# Patient Record
Sex: Female | Born: 1970 | Race: White | Hispanic: No | Marital: Married | Smoking: Never smoker
Health system: Southern US, Community
[De-identification: ages and names within clinical notes are randomized; demographics above are authoritative.]

## PROBLEM LIST (undated history)

## (undated) DIAGNOSIS — R0789 Other chest pain: Secondary | ICD-10-CM

## (undated) DIAGNOSIS — R079 Chest pain, unspecified: Secondary | ICD-10-CM

## (undated) DIAGNOSIS — T7840XA Allergy, unspecified, initial encounter: Secondary | ICD-10-CM

## (undated) DIAGNOSIS — K219 Gastro-esophageal reflux disease without esophagitis: Secondary | ICD-10-CM

## (undated) DIAGNOSIS — K200 Eosinophilic esophagitis: Secondary | ICD-10-CM

## (undated) DIAGNOSIS — I10 Essential (primary) hypertension: Secondary | ICD-10-CM

## (undated) DIAGNOSIS — R030 Elevated blood-pressure reading, without diagnosis of hypertension: Secondary | ICD-10-CM

## (undated) DIAGNOSIS — D649 Anemia, unspecified: Secondary | ICD-10-CM

## (undated) DIAGNOSIS — R9431 Abnormal electrocardiogram [ECG] [EKG]: Secondary | ICD-10-CM

## (undated) HISTORY — DX: Elevated blood-pressure reading, without diagnosis of hypertension: R03.0

## (undated) HISTORY — PX: COLONOSCOPY: SHX174

## (undated) HISTORY — DX: Eosinophilic esophagitis: K20.0

## (undated) HISTORY — DX: Anemia, unspecified: D64.9

## (undated) HISTORY — DX: Essential (primary) hypertension: I10

## (undated) HISTORY — DX: Chest pain, unspecified: R07.9

## (undated) HISTORY — PX: DILATION AND CURETTAGE OF UTERUS: SHX78

## (undated) HISTORY — DX: Allergy, unspecified, initial encounter: T78.40XA

## (undated) HISTORY — DX: Other chest pain: R07.89

## (undated) HISTORY — DX: Gastro-esophageal reflux disease without esophagitis: K21.9

## (undated) HISTORY — PX: FOOT SURGERY: SHX648

## (undated) HISTORY — DX: Abnormal electrocardiogram (ECG) (EKG): R94.31

## (undated) HISTORY — PX: UPPER GASTROINTESTINAL ENDOSCOPY: SHX188

---

## 2003-04-11 ENCOUNTER — Other Ambulatory Visit: Admission: RE | Admit: 2003-04-11 | Discharge: 2003-04-11 | Payer: Self-pay | Admitting: *Deleted

## 2004-04-13 ENCOUNTER — Other Ambulatory Visit: Admission: RE | Admit: 2004-04-13 | Discharge: 2004-04-13 | Payer: Self-pay | Admitting: *Deleted

## 2005-02-11 ENCOUNTER — Other Ambulatory Visit: Admission: RE | Admit: 2005-02-11 | Discharge: 2005-02-11 | Payer: Self-pay | Admitting: Obstetrics & Gynecology

## 2005-04-30 ENCOUNTER — Inpatient Hospital Stay (HOSPITAL_COMMUNITY): Admission: AD | Admit: 2005-04-30 | Discharge: 2005-05-01 | Payer: Self-pay | Admitting: Obstetrics and Gynecology

## 2005-05-26 ENCOUNTER — Ambulatory Visit (HOSPITAL_COMMUNITY): Admission: RE | Admit: 2005-05-26 | Discharge: 2005-05-26 | Payer: Self-pay | Admitting: Obstetrics and Gynecology

## 2005-06-30 ENCOUNTER — Ambulatory Visit (HOSPITAL_COMMUNITY): Admission: RE | Admit: 2005-06-30 | Discharge: 2005-06-30 | Payer: Self-pay | Admitting: Obstetrics & Gynecology

## 2005-09-15 ENCOUNTER — Inpatient Hospital Stay (HOSPITAL_COMMUNITY): Admission: RE | Admit: 2005-09-15 | Discharge: 2005-09-17 | Payer: Self-pay | Admitting: Obstetrics and Gynecology

## 2005-09-18 ENCOUNTER — Encounter: Admission: RE | Admit: 2005-09-18 | Discharge: 2005-10-17 | Payer: Self-pay | Admitting: Obstetrics and Gynecology

## 2007-05-07 IMAGING — US US OB COMP +14 WK
1 series · 13 of 25 positions shown · non-contrast
Comparison: none

CLINICAL DATA: Right lower quadrant pain which has been unrelenting for at least 24 hours.  During the ultrasound exam, the patient described the pain as originating in the right flank region and then radiating around anteriorly into the right lower quadrant.

[Series 1: us ob comp +14 wk · 0.33mm/px · 13 of 25 slices shown]
[im 1/25]
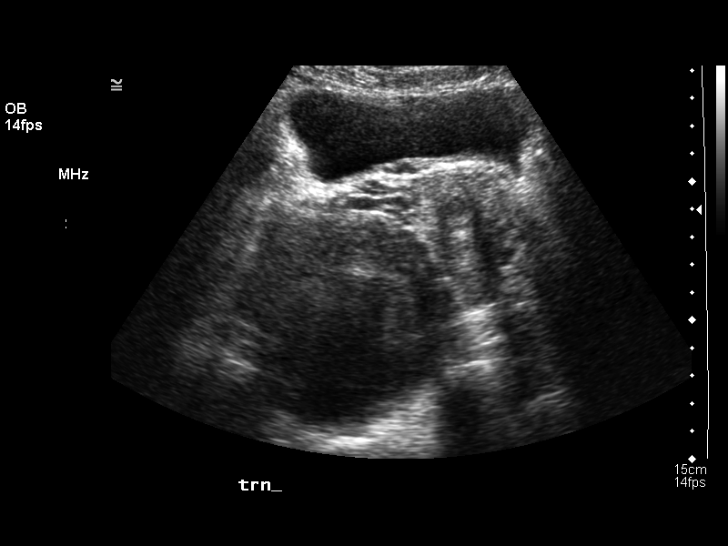
[im 3/25]
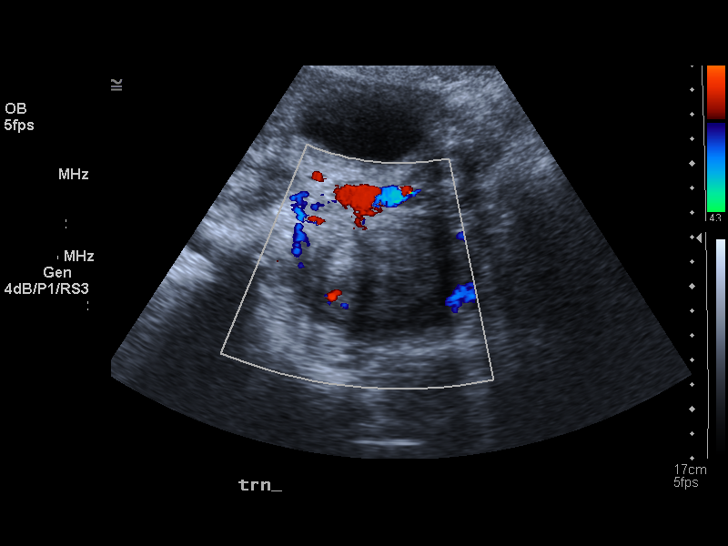
[im 5/25]
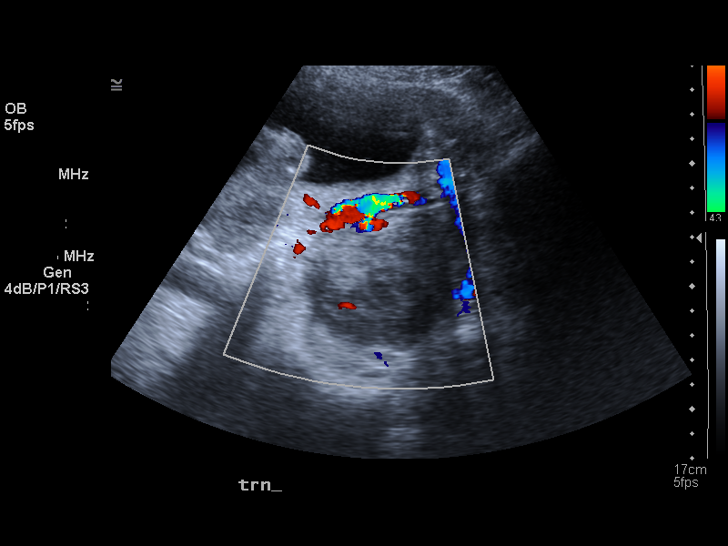
[im 7/25]
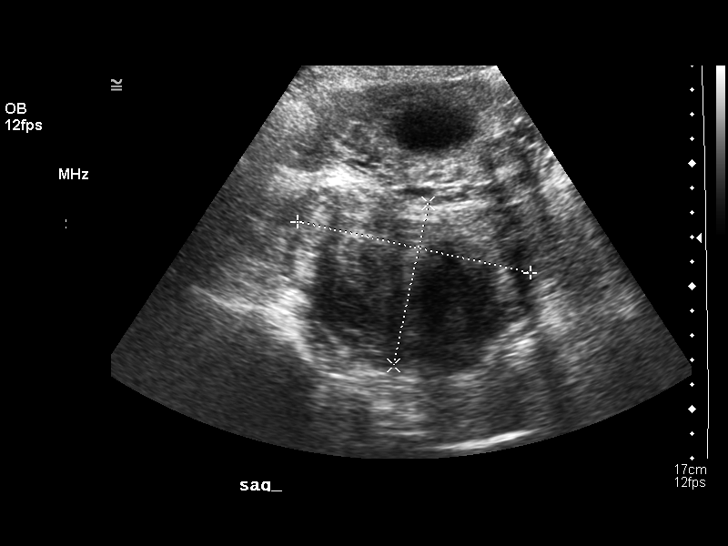
[im 9/25]
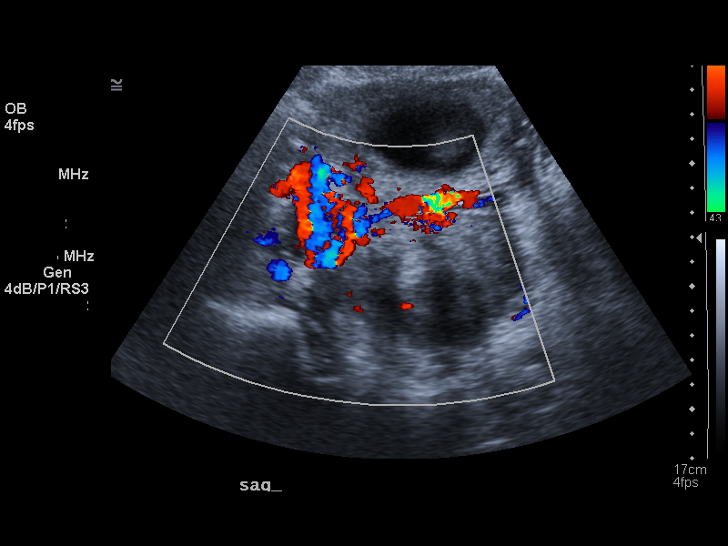
[im 11/25]
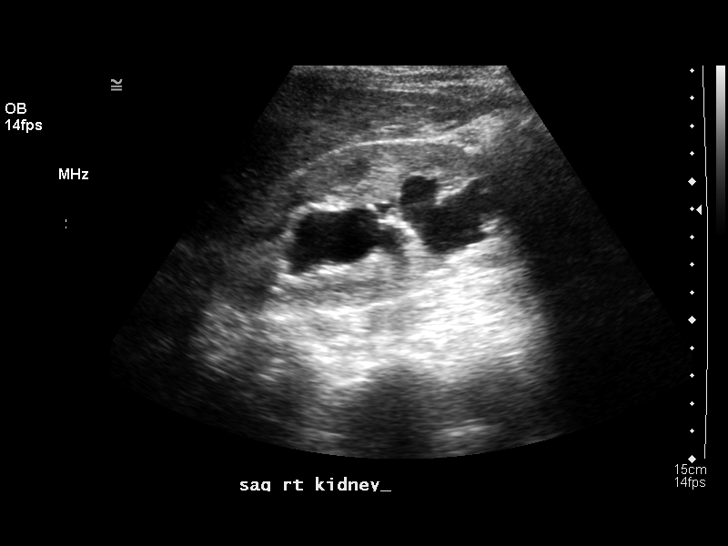
[im 13/25]
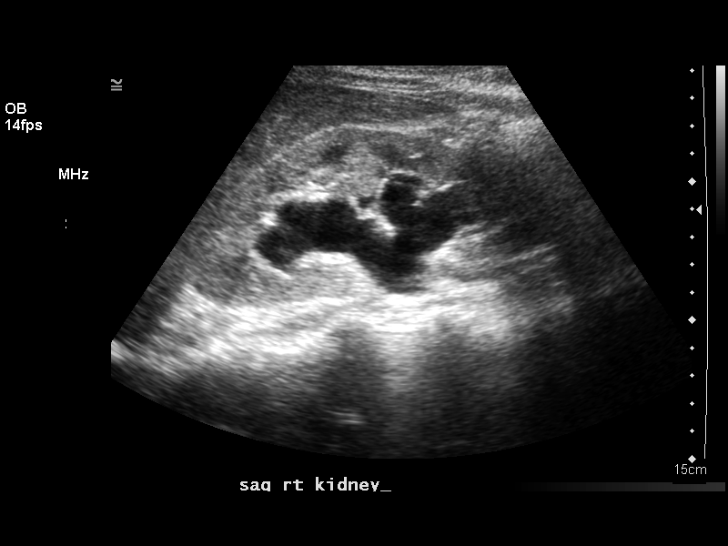
[im 15/25]
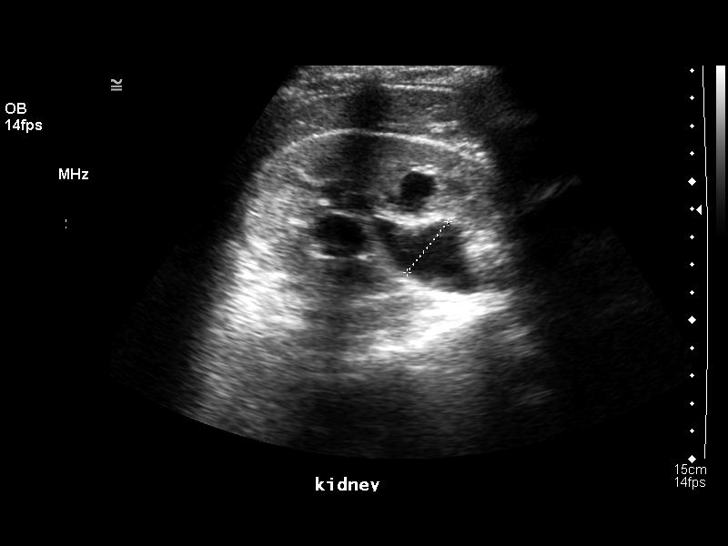
[im 17/25]
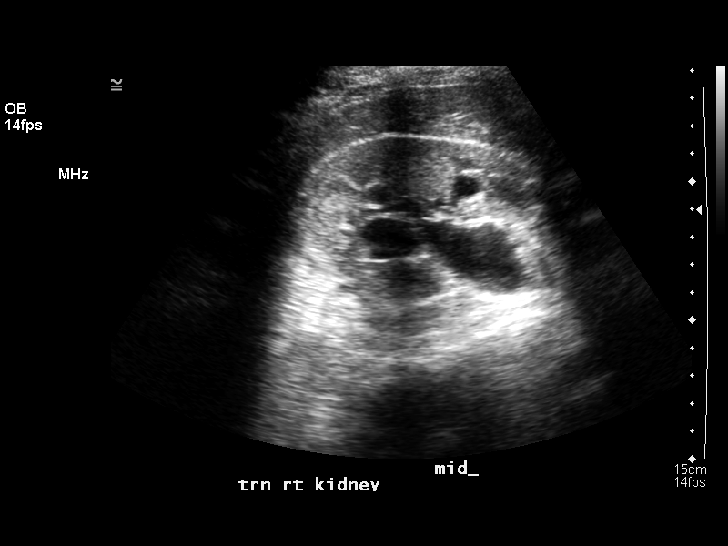
[im 19/25]
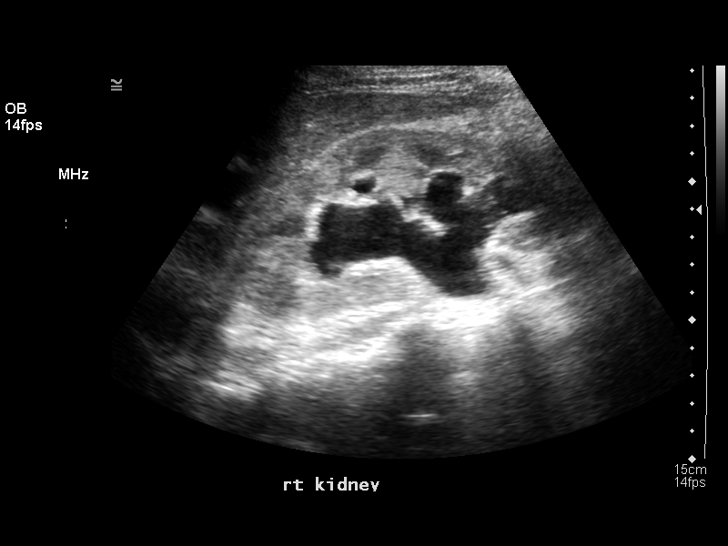
[im 21/25]
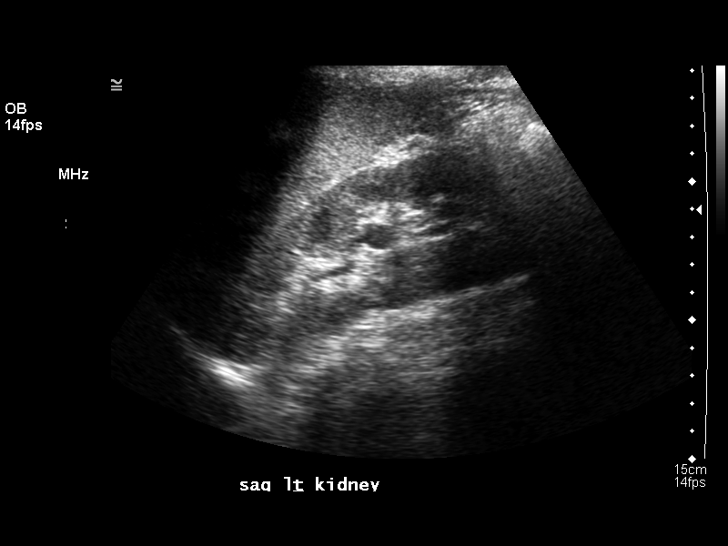
[im 23/25]
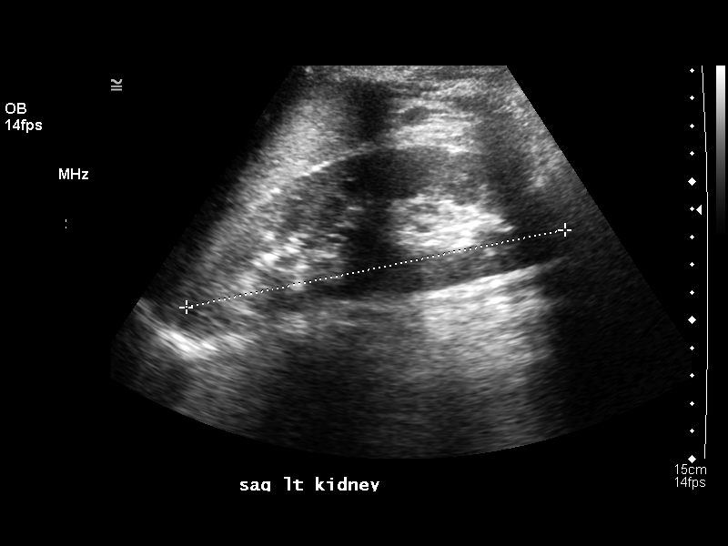
[im 25/25]
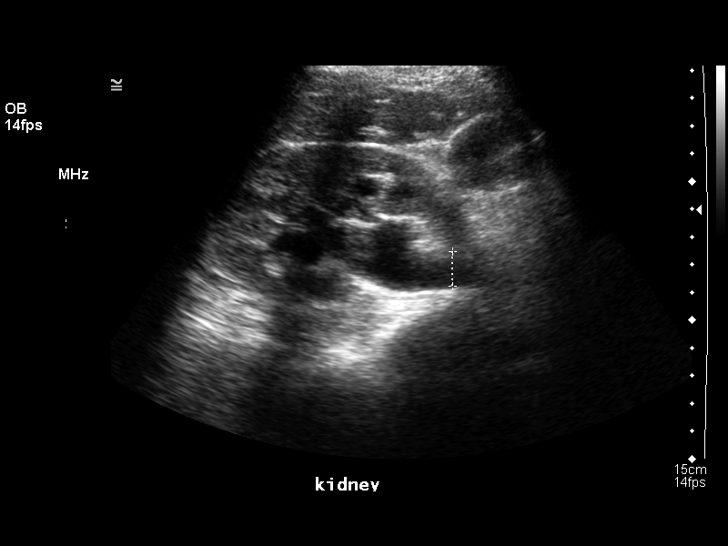

[13 of 25 positions shown; findings below may reference images not displayed]

OBSTETRICAL ULTRASOUND:
 Number of Fetuses: 1
 Heart Rate:  153
 Movement:  Yes
 Breathing:  Yes  
 Presentation:  Breech
 Placental Location:  Anterior
 Grade: I
 Previa:  No
 Amniotic Fluid (Subjective):  Normal
 Amniotic Fluid (Objective):   4.1 cm Vertical pocket 

 FETAL BIOMETRY
 BPD:   4.6 cm  19 w 5 d
 HC:   16.6 cm   19 w 2 d
 AC:   14.5 cm   19 w 6 d
 FL:    2.9 cm   18 w 5 d

 MEAN GA:  19 w 3 d  US EDC:  09/20/05

 FETAL ANATOMY
 Lateral Ventricles:    Visualized 
 Thalami/CSP:      Visualized 
 Posterior Fossa:  Visualized 
 Nuchal Region:    Visualized 
 Spine:      Visualized 
 4 Chamber Heart on Left:      Visualized 
 Stomach on Left:      Visualized 
 3 Vessel Cord:    Visualized 
 Cord Insertion site:    Visualized 
 Kidneys:  Visualized 
 Bladder:  Visualized 
 Extremities:      Visualized 

 ADDITIONAL ANATOMY VISUALIZED:  LVOT, RVOT, upper lip, orbits, profile, diaphragm, heel, 5th digit, ductal arch, aortic arch, and male genitalia.

 MATERNAL UTERINE AND ADNEXAL FINDINGS
 Cervix: 5.4 cm Transabdominally.
 Right lower uterine lower uterine fibroid measuring 9.7 x 8.0 x 8.0 cm.
IMPRESSION: 1.  Single living intrauterine fetus in breech presentation with subjectively normal amniotic fluid volume.  The estimated mean gestational age by ultrasound today is 19 weeks 3 days which is less than 1 week ahead of the reported assigned gestational age.  Visualized fetal anatomy is unremarkable with a good fetal anatomic assessment possible. 
 2.  The patient is noted to have a 9.7 x 8.0 x 8.0 cm subserosal fibroid extending exophytically from the posterolateral right lower uterine segment.  There is demonstrable color flow peripherally within this lesion.  Given the right flank and lower quadrant pain, assessment of the kidneys was undertaken.  The patient is noted to have moderate right sided hydronephrosis with no evidence for left collecting system distention.  The sonographer is able to identify the proximal right ureter which is also dilated, but cannot trace the course of the ureter into the anatomic pelvis.  This constellation of findings is suspicious for extrinsic compression of the distal right ureter with secondary moderate right hydronephrosis.  I called this report directly to Dr. Bisnath at the time of study interpretation.  
 3.  Neither ovary could be reliably identified.  The appendix also could not be discretely visualized.  There is a small amount of free fluid in the cul-de-sac.

## 2008-05-01 ENCOUNTER — Ambulatory Visit (HOSPITAL_COMMUNITY): Admission: RE | Admit: 2008-05-01 | Discharge: 2008-05-01 | Payer: Self-pay | Admitting: Obstetrics and Gynecology

## 2008-05-01 ENCOUNTER — Encounter (INDEPENDENT_AMBULATORY_CARE_PROVIDER_SITE_OTHER): Payer: Self-pay | Admitting: Obstetrics and Gynecology

## 2008-12-18 ENCOUNTER — Inpatient Hospital Stay (HOSPITAL_COMMUNITY): Admission: AD | Admit: 2008-12-18 | Discharge: 2008-12-19 | Payer: Self-pay | Admitting: Obstetrics and Gynecology

## 2009-03-12 ENCOUNTER — Inpatient Hospital Stay (HOSPITAL_COMMUNITY): Admission: AD | Admit: 2009-03-12 | Discharge: 2009-03-14 | Payer: Self-pay | Admitting: Obstetrics and Gynecology

## 2009-03-15 ENCOUNTER — Encounter: Admission: RE | Admit: 2009-03-15 | Discharge: 2009-04-14 | Payer: Self-pay | Admitting: Obstetrics and Gynecology

## 2010-04-28 ENCOUNTER — Other Ambulatory Visit: Payer: Self-pay | Admitting: Obstetrics and Gynecology

## 2010-04-28 LAB — RH IMMUNE GLOB WKUP(>/=20WKS)(NOT WOMEN'S HOSP): Fetal Screen: NEGATIVE

## 2010-04-28 LAB — CBC
HCT: 31.1 % — ABNORMAL LOW (ref 36.0–46.0)
MCHC: 32.6 g/dL (ref 30.0–36.0)
MCV: 78.7 fL (ref 78.0–100.0)
Platelets: 115 10*3/uL — ABNORMAL LOW (ref 150–400)
Platelets: 158 10*3/uL (ref 150–400)
RDW: 14.1 % (ref 11.5–15.5)
WBC: 8.8 10*3/uL (ref 4.0–10.5)

## 2010-04-28 LAB — CCBB MATERNAL DONOR DRAW

## 2010-04-28 LAB — RPR: RPR Ser Ql: NONREACTIVE

## 2010-05-12 LAB — RH IMMUNE GLOBULIN WORKUP (NOT WOMEN'S HOSP): Antibody Screen: NEGATIVE

## 2010-05-12 LAB — RPR: RPR Ser Ql: NONREACTIVE

## 2010-05-12 LAB — CBC
MCHC: 34.3 g/dL (ref 30.0–36.0)
WBC: 7.5 10*3/uL (ref 4.0–10.5)

## 2010-05-20 LAB — RH IMMUNE GLOBULIN WORKUP (NOT WOMEN'S HOSP): Antibody Screen: NEGATIVE

## 2010-05-20 LAB — CBC
Hemoglobin: 13.1 g/dL (ref 12.0–15.0)
MCHC: 33.8 g/dL (ref 30.0–36.0)
Platelets: 223 10*3/uL (ref 150–400)
RBC: 4.45 MIL/uL (ref 3.87–5.11)

## 2010-06-22 NOTE — Op Note (Signed)
[name], Julia Blackwell              ACCOUNT NO.:  [id number]   MEDICAL RECORD NO.:  [id number]          PATIENT TYPE:  AMB   LOCATION:  SDC                           FACILITY:  [facility]   PHYSICIAN:  Carrington Clamp, M.D. DATE OF BIRTH:  07/14/1970   DATE OF PROCEDURE:  05/01/2008  DATE OF DISCHARGE:                               OPERATIVE REPORT   PREOPERATIVE DIAGNOSIS:  Missed abortion.   POSTOPERATIVE DIAGNOSIS:  Missed abortion.   PROCEDURE:  Dilation and evacuation.   SURGEON:  Carrington Clamp, MD   ASSISTANT:  None.   ANESTHESIA:  General LMA.   FINDINGS:  Seven weeks' size uterus down to six weeks with good crie.   SPECIMENS:  Uterine contents to Pathology.   ESTIMATED BLOOD LOSS:  Minimal.   IV FLUIDS:  1000 mL.   URINE OUTPUT:  Not measured.   COMPLICATIONS:  None.   MEDICATIONS:  Methergine 0.2 IM.   Counts were correct x3.   TECHNIQUE:  After adequate anesthesia was achieved, the patient was  prepped and draped in the usual sterile fashion in dorsal lithotomy  position.  Bladder was emptied with a red rubber catheter and a speculum  was placed in the vagina.  A single-tooth tenaculum was placed in the  cervix and the cervix dilated with Shawnie Pons dilators.  An 8-mm  curette was passed into the uterine cavity and suction curettage was  performed.  Alternating suction and sharp curettage was performed, so  good crie to be identified.  Once that was achieved, all instruments  were withdrawn from the vagina and the patient was given Methergine IM.       Carrington Clamp, M.D.  Electronically Signed     MH/MEDQ  D:  05/01/2008  T:  05/01/2008  Job:  [id number]

## 2010-06-25 NOTE — Consult Note (Signed)
NAMEQUINTA, EIMER              ACCOUNT NO.:  [id number]   MEDICAL RECORD NO.:  [id number]          PATIENT TYPE:  INP   LOCATION:  9307                          FACILITY:  [facility]   PHYSICIAN:  Heloise Purpura, MD      DATE OF BIRTH:  18-May-1970   DATE OF CONSULTATION:  04/29/2005  DATE OF DISCHARGE:  05/01/2005                                   CONSULTATION   REASON FOR CONSULTATION:  Right hydronephrosis and right flank pain.   CONSULTING PHYSICIAN:  Carrington Clamp   HISTORY:  Julia Blackwell is a 40 year old female G1, P0 who is approximately [redacted]  weeks gestation with her first child, who began having acute onset of right  lower quadrant pain with radiation to her right flank yesterday morning  associated with increased nausea and vomiting. While the patient has had  significant nausea throughout her entire pregnancy, this was much increased  from her normal daily situation. She denies any fever or chills. She denies  any history of hematuria, urinary tract infections, urolithiasis, GU  malignancy, trauma, or surgery.   PAST MEDICAL HISTORY:  None.   PAST SURGICAL HISTORY:  None.   MEDICATIONS:  The patient is currently taking Zofran and Phenergan as needed  for nausea.   ALLERGIES:  No known drug allergies.   FAMILY HISTORY:  The patient's father has urolithiasis.   SOCIAL HISTORY:  The patient denies tobacco or alcohol use.   REVIEW OF SYSTEMS:  A complete review of systems was reviewed and is  negative.   PHYSICAL EXAMINATION:  VITAL SIGNS:  The patient is currently afebrile with  stable vital signs.   CONSTITUTIONAL:  She is alert and oriented, in no acute distress.  SKIN:  Warm and dry  HEENT:  Normocephalic, atraumatic, oropharynx is clear.  NECK:  No JVD.  CARDIOVASCULAR:  Regular rate and rhythm without obvious murmurs.  LUNGS:  Clear bilaterally.  ABDOMEN:  Soft, nontender, nondistended. A gravid uterus is present.  BACK:  No CVA tenderness although the patient  did just receive Stadol.  EXTREMITIES:  No edema.  NEUROLOGIC:  Grossly intact.   IMAGING:  The patient's ultrasound was reviewed. This demonstrates moderate  right-sided hydronephrosis with dilation of the proximal right ureter. The  patient's bladder was not imaged so ureteral jets were not assessed.   LABORATORY STUDIES:  Serum creatinine is 0.6. White blood count 6 thousand,  hemoglobin 10.2.   Urinalysis pending although by report the patient's urinalysis at Dr.  Kittie Plater office was negative for infection.   IMPRESSION:  Right-sided hydronephrosis and possible right distal ureteral  calculus.   PLAN:  The patient is currently admitted to the hospital and will continue  with IV hydration as well as straining her urine. Her pain control will be  managed as needed. Will plan to check a urinalysis and if she is continuing  to have pain and has not passed a stone by tomorrow morning, I will consider  further imaging with either a repeat ultrasound with attention to the  bladder or possibly a limited IVP if there is no improvement.  ______________________________  Heloise Purpura, MD  Electronically Signed     LB/MEDQ  D:  04/29/2005  T:  05/01/2005  Job:  [id number]

## 2011-05-05 ENCOUNTER — Other Ambulatory Visit: Payer: Self-pay | Admitting: Obstetrics and Gynecology

## 2011-05-18 ENCOUNTER — Other Ambulatory Visit: Payer: Self-pay | Admitting: Obstetrics and Gynecology

## 2011-05-18 DIAGNOSIS — R928 Other abnormal and inconclusive findings on diagnostic imaging of breast: Secondary | ICD-10-CM

## 2011-05-20 ENCOUNTER — Other Ambulatory Visit: Payer: Self-pay | Admitting: Obstetrics and Gynecology

## 2011-05-20 ENCOUNTER — Other Ambulatory Visit: Payer: Self-pay

## 2011-05-20 ENCOUNTER — Ambulatory Visit
Admission: RE | Admit: 2011-05-20 | Discharge: 2011-05-20 | Disposition: A | Discharge status: Home / Self Care | Payer: BC Managed Care – PPO | Source: Ambulatory Visit | Attending: Obstetrics and Gynecology | Admitting: Obstetrics and Gynecology

## 2011-05-20 DIAGNOSIS — R928 Other abnormal and inconclusive findings on diagnostic imaging of breast: Secondary | ICD-10-CM

## 2011-11-03 ENCOUNTER — Other Ambulatory Visit: Payer: Self-pay

## 2011-11-17 ENCOUNTER — Other Ambulatory Visit: Payer: BC Managed Care – PPO

## 2011-11-17 ENCOUNTER — Ambulatory Visit
Admission: RE | Admit: 2011-11-17 | Discharge: 2011-11-17 | Disposition: A | Discharge status: Home / Self Care | Payer: BC Managed Care – PPO | Source: Ambulatory Visit | Attending: Obstetrics and Gynecology | Admitting: Obstetrics and Gynecology

## 2011-11-17 DIAGNOSIS — R928 Other abnormal and inconclusive findings on diagnostic imaging of breast: Secondary | ICD-10-CM

## 2012-05-08 ENCOUNTER — Other Ambulatory Visit: Payer: Self-pay | Admitting: Obstetrics and Gynecology

## 2012-10-23 ENCOUNTER — Other Ambulatory Visit: Payer: Self-pay

## 2013-05-20 ENCOUNTER — Other Ambulatory Visit: Payer: Self-pay | Admitting: Obstetrics and Gynecology

## 2013-05-20 DIAGNOSIS — N6310 Unspecified lump in the right breast, unspecified quadrant: Secondary | ICD-10-CM

## 2013-05-28 ENCOUNTER — Ambulatory Visit
Admission: RE | Admit: 2013-05-28 | Discharge: 2013-05-28 | Disposition: A | Discharge status: Home / Self Care | Payer: BC Managed Care – PPO | Source: Ambulatory Visit | Attending: Obstetrics and Gynecology | Admitting: Obstetrics and Gynecology

## 2013-05-28 ENCOUNTER — Encounter (INDEPENDENT_AMBULATORY_CARE_PROVIDER_SITE_OTHER): Payer: Self-pay

## 2013-05-28 DIAGNOSIS — N6310 Unspecified lump in the right breast, unspecified quadrant: Secondary | ICD-10-CM

## 2013-11-14 ENCOUNTER — Encounter (HOSPITAL_COMMUNITY): Payer: Self-pay | Admitting: *Deleted

## 2013-11-14 ENCOUNTER — Inpatient Hospital Stay (HOSPITAL_COMMUNITY)
Admission: AD | Admit: 2013-11-14 | Discharge: 2013-11-14 | Disposition: A | Discharge status: Home / Self Care | Payer: BC Managed Care – PPO | Source: Ambulatory Visit | Attending: Obstetrics and Gynecology | Admitting: Obstetrics and Gynecology

## 2013-11-14 DIAGNOSIS — N920 Excessive and frequent menstruation with regular cycle: Secondary | ICD-10-CM | POA: Diagnosis present

## 2013-11-14 DIAGNOSIS — N938 Other specified abnormal uterine and vaginal bleeding: Secondary | ICD-10-CM | POA: Insufficient documentation

## 2013-11-14 DIAGNOSIS — N939 Abnormal uterine and vaginal bleeding, unspecified: Secondary | ICD-10-CM

## 2013-11-14 LAB — URINALYSIS, ROUTINE W REFLEX MICROSCOPIC
Bilirubin Urine: NEGATIVE
GLUCOSE, UA: NEGATIVE mg/dL
KETONES UR: 15 mg/dL — AB
Leukocytes, UA: NEGATIVE
Nitrite: NEGATIVE
PH: 5.5 (ref 5.0–8.0)
Protein, ur: NEGATIVE mg/dL
Specific Gravity, Urine: 1.02 (ref 1.005–1.030)
Urobilinogen, UA: 0.2 mg/dL (ref 0.0–1.0)

## 2013-11-14 LAB — POCT PREGNANCY, URINE: Preg Test, Ur: NEGATIVE

## 2013-11-14 LAB — CBC
HCT: 37.6 % (ref 36.0–46.0)
HEMOGLOBIN: 12.6 g/dL (ref 12.0–15.0)
MCH: 29.6 pg (ref 26.0–34.0)
MCHC: 33.5 g/dL (ref 30.0–36.0)
MCV: 88.3 fL (ref 78.0–100.0)
Platelets: 219 10*3/uL (ref 150–400)
RBC: 4.26 MIL/uL (ref 3.87–5.11)
RDW: 13.1 % (ref 11.5–15.5)
WBC: 5 10*3/uL (ref 4.0–10.5)

## 2013-11-14 LAB — URINE MICROSCOPIC-ADD ON

## 2013-11-14 MED ORDER — KETOROLAC TROMETHAMINE 60 MG/2ML IM SOLN
60.0000 mg | Freq: Once | INTRAMUSCULAR | Status: AC
  Administered 2013-11-14: 60 mg via INTRAMUSCULAR
  Filled 2013-11-14: qty 2

## 2013-11-14 NOTE — MAU Provider Note (Signed)
History     CSN: [id number]  Arrival date and time: 11/14/13 1225   First Provider Initiated Contact with Patient 11/14/13 1308      Chief Complaint  Patient presents with  . Vaginal Bleeding  . Abdominal Cramping   HPI Pt is not pregnant and is here with complaint of menorrhagia.  Pt was not able to be seen in the office due to  Sickness of ultrasound tech. Pt has been on continuous Ocs and has not had any bleeding until 2 weeks ago.  Pt has had bleeding through cloths with clots and cramping.  Pt denies dizziness.  Pt has been taking naproxen for the cramping.  Pt is on a time constraint due to children dentist appointments today at 2 pm RN note: Patient states she has a history of fibroids. Has been on continous BCP's but has been bleeding for about 2 weeks getting heavier and passing clots. Some abdominal cramping.  History reviewed. No pertinent past medical history.  Past Surgical History  Procedure Laterality Date  . Dilation and curettage of uterus      No family history on file.  History  Substance Use Topics  . Smoking status: Never Smoker   . Smokeless tobacco: Not on file  . Alcohol Use: No    Allergies: No Known Allergies  Prescriptions prior to admission  Medication Sig Dispense Refill  . ferrous sulfate 325 (65 FE) MG tablet Take 325 mg by mouth daily with breakfast.      . loratadine (CLARITIN) 10 MG tablet Take 10 mg by mouth daily.      . naproxen sodium (ANAPROX) 220 MG tablet Take 220 mg by mouth 2 (two) times daily with a meal.      . norgestimate-ethinyl estradiol (ORTHO-CYCLEN,SPRINTEC,PREVIFEM) 0.25-35 MG-MCG tablet Take 1 tablet by mouth daily.        Review of Systems  Constitutional: Negative for fever and chills.  Gastrointestinal: Positive for abdominal pain. Negative for nausea, vomiting, diarrhea and constipation.  Genitourinary: Negative for dysuria.   Physical Exam   Blood pressure 151/87, pulse 90, temperature 98.9 F (37.2 C),  temperature source Oral, resp. rate 16, height 5\' 9"  (1.753 m), weight 160 lb (72.576 kg), SpO2 98.00%.  Physical Exam  Nursing note and vitals reviewed. Constitutional: She is oriented to person, place, and time. She appears well-developed and well-nourished. No distress.  HENT:  Head: Normocephalic.  Eyes: Pupils are equal, round, and reactive to light.  Neck: Normal range of motion. Neck supple.  Cardiovascular: Normal rate.   Respiratory: Effort normal.  GI: Soft.  Musculoskeletal: Normal range of motion.  Neurological: She is alert and oriented to person, place, and time.  Skin: Skin is warm and dry.  Psychiatric: She has a normal mood and affect.    MAU Course  Procedures Results for orders placed during the hospital encounter of 11/14/13 (from the past 24 hour(s))  URINALYSIS, ROUTINE W REFLEX MICROSCOPIC     Status: Abnormal   Collection Time    11/14/13 12:50 PM      Result Value Ref Range   Color, Urine YELLOW  YELLOW   APPearance CLEAR  CLEAR   Specific Gravity, Urine 1.020  1.005 - 1.030   pH 5.5  5.0 - 8.0   Glucose, UA NEGATIVE  NEGATIVE mg/dL   Hgb urine dipstick SMALL (*) NEGATIVE   Bilirubin Urine NEGATIVE  NEGATIVE   Ketones, ur 15 (*) NEGATIVE mg/dL   Protein, ur NEGATIVE  NEGATIVE mg/dL  Urobilinogen, UA 0.2  0.0 - 1.0 mg/dL   Nitrite NEGATIVE  NEGATIVE   Leukocytes, UA NEGATIVE  NEGATIVE  URINE MICROSCOPIC-ADD ON     Status: Abnormal   Collection Time    11/14/13 12:50 PM      Result Value Ref Range   Squamous Epithelial / LPF FEW (*) RARE   WBC, UA 0-2  <3 WBC/hpf   RBC / HPF 3-6  <3 RBC/hpf   Urine-Other MUCOUS PRESENT    POCT PREGNANCY, URINE     Status: None   Collection Time    11/14/13  1:01 PM      Result Value Ref Range   Preg Test, Ur NEGATIVE  NEGATIVE  CBC pending Results for orders placed during the hospital encounter of 11/14/13 (from the past 24 hour(s))  URINALYSIS, ROUTINE W REFLEX MICROSCOPIC     Status: Abnormal   Collection  Time    11/14/13 12:50 PM      Result Value Ref Range   Color, Urine YELLOW  YELLOW   APPearance CLEAR  CLEAR   Specific Gravity, Urine 1.020  1.005 - 1.030   pH 5.5  5.0 - 8.0   Glucose, UA NEGATIVE  NEGATIVE mg/dL   Hgb urine dipstick SMALL (*) NEGATIVE   Bilirubin Urine NEGATIVE  NEGATIVE   Ketones, ur 15 (*) NEGATIVE mg/dL   Protein, ur NEGATIVE  NEGATIVE mg/dL   Urobilinogen, UA 0.2  0.0 - 1.0 mg/dL   Nitrite NEGATIVE  NEGATIVE   Leukocytes, UA NEGATIVE  NEGATIVE  URINE MICROSCOPIC-ADD ON     Status: Abnormal   Collection Time    11/14/13 12:50 PM      Result Value Ref Range   Squamous Epithelial / LPF FEW (*) RARE   WBC, UA 0-2  <3 WBC/hpf   RBC / HPF 3-6  <3 RBC/hpf   Urine-Other MUCOUS PRESENT    POCT PREGNANCY, URINE     Status: None   Collection Time    11/14/13  1:01 PM      Result Value Ref Range   Preg Test, Ur NEGATIVE  NEGATIVE  CBC     Status: None   Collection Time    11/14/13  1:30 PM      Result Value Ref Range   WBC 5.0  4.0 - 10.5 K/uL   RBC 4.26  3.87 - 5.11 MIL/uL   Hemoglobin 12.6  12.0 - 15.0 g/dL   HCT 11.937.6  14.736.0 - 82.946.0 %   MCV 88.3  78.0 - 100.0 fL   MCH 29.6  26.0 - 34.0 pg   MCHC 33.5  30.0 - 36.0 g/dL   RDW 56.213.1  13.011.5 - 86.515.5 %   Platelets 219  150 - 400 K/uL   Will schedule outpatient US Pt had to leave b/f ultrasound could be performed  Assessment and Plan  Abnormal uterine bleeding Continue continuous OCPs; US as scheduled and f/u with dr. Sheralyn BoatmanHorvath  LINEBERRY,SUSAN 11/14/2013, 1:37 PM

## 2013-11-14 NOTE — Discharge Instructions (Signed)
Abnormal Uterine Bleeding Abnormal uterine bleeding can affect women at various stages in life, including teenagers, women in their reproductive years, pregnant women, and women who have reached menopause. Several kinds of uterine bleeding are considered abnormal, including:  Bleeding or spotting between periods.   Bleeding after sexual intercourse.   Bleeding that is heavier or more than normal.   Periods that last longer than usual.  Bleeding after menopause.  Many cases of abnormal uterine bleeding are minor and simple to treat, while others are more serious. Any type of abnormal bleeding should be evaluated by your health care provider. Treatment will depend on the cause of the bleeding. HOME CARE INSTRUCTIONS Monitor your condition for any changes. The following actions may help to alleviate any discomfort you are experiencing:  Avoid the use of tampons and douches as directed by your health care provider.  Change your pads frequently. You should get regular pelvic exams and Pap tests. Keep all follow-up appointments for diagnostic tests as directed by your health care provider.  SEEK MEDICAL CARE IF:   Your bleeding lasts more than 1 week.   You feel dizzy at times.  SEEK IMMEDIATE MEDICAL CARE IF:   You pass out.   You are changing pads every 15 to 30 minutes.   You have abdominal pain.  You have a fever.   You become sweaty or weak.   You are passing large blood clots from the vagina.   You start to feel nauseous and vomit. MAKE SURE YOU:   Understand these instructions.  Will watch your condition.  Will get help right away if you are not doing well or get worse. Document Released: 01/24/2005 Document Revised: 01/29/2013 Document Reviewed: 08/23/2012 Exit[employer] Patient Information 2015 [employer]. This information is not intended to replace advice given to you by your health care provider. Make sure you discuss any questions you have with your  health care provider.  

## 2013-11-14 NOTE — MAU Note (Signed)
Patient states she has a history of fibroids. Has been on continous BCP's  but has been bleeding for about 2 weeks getting heavier and passing clots. Some abdominal cramping.

## 2013-11-14 NOTE — MAU Note (Signed)
Was scheduled for an ultrasound next week but when she called the office she was instructed to come to MAU.

## 2013-11-15 ENCOUNTER — Ambulatory Visit (HOSPITAL_COMMUNITY)
Admission: RE | Admit: 2013-11-15 | Discharge: 2013-11-15 | Disposition: A | Discharge status: Home / Self Care | Payer: BC Managed Care – PPO | Source: Ambulatory Visit | Attending: Obstetrics and Gynecology | Admitting: Obstetrics and Gynecology

## 2013-11-15 ENCOUNTER — Ambulatory Visit (HOSPITAL_COMMUNITY): Payer: BC Managed Care – PPO

## 2013-11-15 ENCOUNTER — Other Ambulatory Visit: Payer: Self-pay | Admitting: Obstetrics and Gynecology

## 2013-11-15 DIAGNOSIS — N924 Excessive bleeding in the premenopausal period: Secondary | ICD-10-CM

## 2013-11-15 DIAGNOSIS — N939 Abnormal uterine and vaginal bleeding, unspecified: Secondary | ICD-10-CM | POA: Diagnosis present

## 2013-11-19 ENCOUNTER — Other Ambulatory Visit: Payer: Self-pay

## 2013-12-09 ENCOUNTER — Encounter (HOSPITAL_COMMUNITY): Payer: Self-pay | Admitting: *Deleted

## 2014-01-21 ENCOUNTER — Other Ambulatory Visit: Payer: Self-pay | Admitting: Orthopedic Surgery

## 2014-04-21 ENCOUNTER — Other Ambulatory Visit: Payer: Self-pay

## 2014-04-21 DIAGNOSIS — Z1231 Encounter for screening mammogram for malignant neoplasm of breast: Secondary | ICD-10-CM

## 2014-06-02 ENCOUNTER — Ambulatory Visit: Payer: Self-pay

## 2016-05-23 DIAGNOSIS — E041 Nontoxic single thyroid nodule: Secondary | ICD-10-CM

## 2016-05-23 DIAGNOSIS — R1314 Dysphagia, pharyngoesophageal phase: Secondary | ICD-10-CM

## 2016-05-23 HISTORY — DX: Nontoxic single thyroid nodule: E04.1

## 2016-05-23 HISTORY — DX: Dysphagia, pharyngoesophageal phase: R13.14

## 2016-05-24 ENCOUNTER — Other Ambulatory Visit: Payer: Self-pay | Admitting: Otolaryngology

## 2016-05-24 DIAGNOSIS — E041 Nontoxic single thyroid nodule: Secondary | ICD-10-CM

## 2016-05-30 ENCOUNTER — Other Ambulatory Visit: Payer: Self-pay | Admitting: Otolaryngology

## 2016-05-30 ENCOUNTER — Ambulatory Visit
Admission: RE | Admit: 2016-05-30 | Discharge: 2016-05-30 | Disposition: A | Discharge status: Home / Self Care | Payer: BLUE CROSS/BLUE SHIELD | Source: Ambulatory Visit | Attending: Otolaryngology | Admitting: Otolaryngology

## 2016-05-30 DIAGNOSIS — E041 Nontoxic single thyroid nodule: Secondary | ICD-10-CM

## 2016-05-30 DIAGNOSIS — R1314 Dysphagia, pharyngoesophageal phase: Secondary | ICD-10-CM

## 2016-05-31 ENCOUNTER — Ambulatory Visit
Admission: RE | Admit: 2016-05-31 | Discharge: 2016-05-31 | Disposition: A | Discharge status: Home / Self Care | Payer: BLUE CROSS/BLUE SHIELD | Source: Ambulatory Visit | Attending: Otolaryngology | Admitting: Otolaryngology

## 2016-05-31 DIAGNOSIS — R1314 Dysphagia, pharyngoesophageal phase: Secondary | ICD-10-CM

## 2016-06-02 ENCOUNTER — Encounter: Payer: Self-pay | Admitting: Physician Assistant

## 2016-06-09 ENCOUNTER — Encounter: Payer: Self-pay | Admitting: Physician Assistant

## 2016-06-09 ENCOUNTER — Encounter: Payer: Self-pay | Admitting: Internal Medicine

## 2016-06-09 ENCOUNTER — Ambulatory Visit (INDEPENDENT_AMBULATORY_CARE_PROVIDER_SITE_OTHER): Payer: BLUE CROSS/BLUE SHIELD | Admitting: Physician Assistant

## 2016-06-09 VITALS — BP 120/82 | HR 80 | Ht 69.0 in | Wt 166.6 lb

## 2016-06-09 DIAGNOSIS — R1319 Other dysphagia: Secondary | ICD-10-CM | POA: Diagnosis not present

## 2016-06-09 DIAGNOSIS — R933 Abnormal findings on diagnostic imaging of other parts of digestive tract: Secondary | ICD-10-CM

## 2016-06-09 DIAGNOSIS — R1310 Dysphagia, unspecified: Secondary | ICD-10-CM | POA: Diagnosis not present

## 2016-06-09 MED ORDER — RANITIDINE HCL 150 MG PO TABS: 150.0000 mg | ORAL_TABLET | Freq: Every day | ORAL | 3 refills | Status: DC

## 2016-06-09 NOTE — Patient Instructions (Signed)
We have sent the following medications to your pharmacy for you to pick up at your convenience: 1. Zantac ( Ranitidine)  150 mg  You have been scheduled for an Endoscopy . Please follow written instructions given to you at your visit today.   If you use inhalers (even only as needed), please bring them with you on the day of your procedure. Your physician has requested that you go to www.startemmi.com and enter the access code given to you at your visit today. This web site gives a general overview about your procedure. However, you should still follow specific instructions given to you by our office regarding your preparation for the procedure.

## 2016-06-09 NOTE — Progress Notes (Addendum)
Subjective:    Patient ID: Julia Blackwell, female    DOB: [birth year], 46 y.o.   MRN: [id number]  HPI Julia Blackwell is a pleasant 46 year old white female, new to GI today referred by Dr. Elspeth Blackwell ENT for evaluation of dysphagia. Patient had undergone barium swallow on 05/31/2016 which showed mild GERD, slight indentation of the left cervical esophagus secondary to thyroid nodules, barium tablet lodged just above the GE junction suggestive of a short stricture. There is no hiatal hernia, and esophageal motility normal. Patient is generally in good health. She relates having a prior colonoscopy done per Dr. Loreta Blackwell several years ago. She has not had prior EGD. She says her current symptoms have been present over the past year or so and she has noted some progression. She had a recent episode at work with the PCF food becoming lodged and she had to regurgitate. She has also had several episodes of pill dysphagia. She says she is probably having to regurgitate something at least once a week, and in between those episodes she continues to have solid food dysphagia which requires her to stop eating to allow food to go down. She has noted problems with rice and meat particularly. No difficulty with liquids. She's not had any regular problems with heartburn and indigestion though she has had on occasion with spicy foods etc.  Review of Systems Pertinent positive and negative review of systems were noted in the above HPI section.  All other review of systems was otherwise negative.  Outpatient Encounter Prescriptions as of 06/09/2016  Medication Sig  . ferrous sulfate 325 (65 FE) MG tablet Take 325 mg by mouth daily with breakfast.  . loratadine (CLARITIN) 10 MG tablet Take 10 mg by mouth daily.  . naproxen sodium (ANAPROX) 220 MG tablet Take 220 mg by mouth 2 (two) times daily with a meal.  . norgestimate-ethinyl estradiol (ORTHO-CYCLEN,SPRINTEC,PREVIFEM) 0.25-35 MG-MCG tablet Take 1 tablet by mouth daily.  .  ranitidine (ZANTAC) 150 MG tablet Take 1 tablet (150 mg total) by mouth at bedtime.   No facility-administered encounter medications on file as of 06/09/2016.    No Known Allergies There are no active problems to display for this patient.  Social History   Social History  . Marital status: Married    Spouse name: N/A  . Number of children: N/A  . Years of education: N/A   Occupational History  . Not on file.   Social History Main Topics  . Smoking status: Never Smoker  . Smokeless tobacco: Not on file  . Alcohol use Yes     Comment: 1 drink daily  . Drug use: No  . Sexual activity: Yes    Birth control/ protection: Pill   Other Topics Concern  . Not on file   Social History Narrative  . No narrative on file    Ms. Julia Blackwell's family history includes Breast cancer in her maternal aunt and mother; Colon cancer in her other; Diabetes in her father; Heart disease in her father.      Objective:    Vitals:   06/09/16 0813  BP: 120/82  Pulse: 80    Physical Exam  well-developed white female in no acute distress, accompanied by her mother blood pressure 120/82 pulse 80, height 5 foot 9, weight 166, BMI 24.6. 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 #2 family history of colon polyps in patient's father, maternal great-grandfather with colon cancer. Patient did have a colonoscopy about 7 years ago per Dr. Loreta AveMann. #3 multinodular goiter noted on  recent thyroid ultrasound recommended annual ultrasound follow-up  Plan; Patient will be scheduled for upper endoscopy with probable esophageal dilation with Dr. Leone PayorGessner. Procedure discussed in detail with patient including risks and benefits and she is agreeable to proceed. Patient has signed a release and will obtain her previous colonoscopy report, so can determine need for interval follow-up Patient is advised to see her PCP regarding annual thyroid ultrasound.  Amy S Esterwood PA-C 06/09/2016   Cc: Julia Colonelosen, Jefry, MD  Agree with Ms. Oswald HillockEsterwood's assessment and plan. Iva Booparl E. Gessner, MD, Clementeen GrahamFACG

## 2016-06-15 ENCOUNTER — Encounter: Payer: Self-pay | Admitting: Internal Medicine

## 2016-06-15 ENCOUNTER — Ambulatory Visit (AMBULATORY_SURGERY_CENTER): Payer: BLUE CROSS/BLUE SHIELD | Admitting: Internal Medicine

## 2016-06-15 VITALS — BP 148/70 | HR 79 | Temp 97.8°F | Resp 13 | Ht 69.0 in | Wt 166.0 lb

## 2016-06-15 DIAGNOSIS — K228 Other specified diseases of esophagus: Secondary | ICD-10-CM | POA: Diagnosis not present

## 2016-06-15 DIAGNOSIS — K222 Esophageal obstruction: Secondary | ICD-10-CM | POA: Diagnosis not present

## 2016-06-15 DIAGNOSIS — R1319 Other dysphagia: Secondary | ICD-10-CM

## 2016-06-15 MED ORDER — SODIUM CHLORIDE 0.9 % IV SOLN: 500.0000 mL | INTRAVENOUS | Status: DC

## 2016-06-15 MED ORDER — PANTOPRAZOLE SODIUM 40 MG PO TBEC: 40.0000 mg | DELAYED_RELEASE_TABLET | Freq: Every day | ORAL | 3 refills | Status: DC

## 2016-06-15 MED ORDER — RANITIDINE HCL 150 MG PO TABS: 150.0000 mg | ORAL_TABLET | Freq: Every evening | ORAL | 3 refills | Status: DC | PRN

## 2016-06-15 NOTE — Progress Notes (Signed)
Report to PACU, RN, vss, BBS= Clear.  

## 2016-06-15 NOTE — Patient Instructions (Addendum)
I think you have a condition known as eosinophilic esophagitis. In eosinophilic esophagitis (e-o-sin-o-FILL-ik uh-sof-uh-JIE-tis), a type of white blood cell (eosinophil) builds up in the lining of the tube that connects your mouth to your stomach (esophagus). This buildup, which is a reaction to foods, allergens or acid reflux, can inflame or injure the esophageal tissue. Damaged esophageal tissue can lead to difficulty swallowing or cause food to get caught when you swallow.  Eosinophilic esophagitis is a chronic immune system disease. It has been identified only in the past two decades, but is now considered a major cause of digestive system (gastrointestinal) illness. Research is ongoing and will likely lead to revisions in its diagnosis and treatment.  I am stopping the ranitidine (you can take as needed) and starting pantoprazole 40 mg daily before breakfast. This will help you swallow better and maintain the dilation I did today. I will let you know pathology results by My Chart. Please go ahead and schedule an appointment to see me in July sometime soon.  I appreciate the opportunity to care for you. Iva Booparl E. Marvella Jenning, MD, FACG   YOU HAD AN ENDOSCOPIC PROCEDURE TODAY AT THE [facility]:   Refer to the procedure report that was given to you for any specific questions about what was found during the examination.  If the procedure report does not answer your questions, please call your gastroenterologist to clarify.  If you requested that your care partner not be given the details of your procedure findings, then the procedure report has been included in a sealed envelope for you to review at your convenience later.  YOU SHOULD EXPECT: Some feelings of bloating in the abdomen. Passage of more gas than usual.  Walking can help get rid of the air that was put into your GI tract during the procedure and reduce the bloating. If you had a lower endoscopy (such as a colonoscopy or  flexible sigmoidoscopy) you may notice spotting of blood in your stool or on the toilet paper. If you underwent a bowel prep for your procedure, you may not have a normal bowel movement for a few days.  Please Note:  You might notice some irritation and congestion in your nose or some drainage.  This is from the oxygen used during your procedure.  There is no need for concern and it should clear up in a day or so.  SYMPTOMS TO REPORT IMMEDIATELY:    Following upper endoscopy (EGD)  Vomiting of blood or coffee ground material  New chest pain or pain under the shoulder blades  Painful or persistently difficult swallowing  New shortness of breath  Fever of 100F or higher  Black, tarry-looking stools  For urgent or emergent issues, a gastroenterologist can be reached at any hour by calling [phone].   DIET:  You will be on a lear liquid diet for 1 hour, and then a soft diet for the rest of today to prevent choking. Tomorrow, you may have a regular diet.  Drink plenty of fluids but you should avoid alcoholic beverages for 24 hours.  ACTIVITY:  You should plan to take it easy for the rest of today and you should NOT DRIVE or use heavy machinery until tomorrow (because of the sedation medicines used during the test).    FOLLOW UP: Our staff will call the number listed on your records the next business day following your procedure to check on you and address any questions or concerns that you may have  regarding the information given to you following your procedure. If we do not reach you, we will leave a message.  However, if you are feeling well and you are not experiencing any problems, there is no need to return our call.  We will assume that you have returned to your regular daily activities without incident.  If any biopsies were taken you will be contacted by phone or by letter within the next 1-3 weeks.  Please call us at [phone] if you have not heard about the biopsies in 3  weeks.    SIGNATURES/CONFIDENTIALITY: You and/or your care partner have signed paperwork which will be entered into your electronic medical record.  These signatures attest to the fact that that the information above on your After Visit Summary has been reviewed and is understood.  Full responsibility of the confidentiality of this discharge information lies with you and/or your care-partner.  Read all of the handouts given to you by your recovery room nurse.  Take your new medication as directed.

## 2016-06-15 NOTE — Progress Notes (Signed)
Pt's states no medical or surgical changes since previsit or office visit. 

## 2016-06-15 NOTE — Op Note (Addendum)
[facility] Patient Name: Julia Blackwell Procedure Date: 06/15/2016 11:04 AM MRN: [id number] Endoscopist: Iva Boop , MD Age: 46 Referring MD:  Date of Birth: 1970/02/29 Gender: Female Account #: [id number] Procedure:                Upper GI endoscopy Indications:              Dysphagia, Suspected stricture of the esophagus Medicines:                Propofol per Anesthesia, Monitored Anesthesia Care Procedure:                Pre-Anesthesia Assessment:                           - Prior to the procedure, a History and Physical                            was performed, and patient medications and                            allergies were reviewed. The patient's tolerance of                            previous anesthesia was also reviewed. The risks                            and benefits of the procedure and the sedation                            options and risks were discussed with the patient.                            All questions were answered, and informed consent                            was obtained. Prior Anticoagulants: The patient has                            taken no previous anticoagulant or antiplatelet                            agents. ASA Grade Assessment: II - A patient with                            mild systemic disease. After reviewing the risks                            and benefits, the patient was deemed in                            satisfactory condition to undergo the procedure.                           After obtaining informed consent, the endoscope was  passed under direct vision. Throughout the                            procedure, the patient's blood pressure, pulse, and                            oxygen saturations were monitored continuously. The                            Endoscope was introduced through the mouth, and                            advanced to the second part of duodenum. The upper                   GI endoscopy was accomplished without difficulty.                            The patient tolerated the procedure fairly well. Scope In: Scope Out: Findings:                 One moderate benign-appearing, intrinsic stenosis                            was found 37 cm from the incisors. This measured                            1.6 cm (inner diameter) x less than one cm (in                            length) and was traversed. A TTS dilator was passed                            through the scope. Dilation with a 16-17-18 mm                            balloon dilator was performed to 18 mm. The                            dilation site was examined and showed moderate                            improvement in luminal narrowing. Estimated blood                            loss was minimal. Biopsies were taken with a cold                            forceps for histology. Verification of patient                            identification for the specimen was done. Estimated  blood loss was minimal.                           Mucosal changes including ringed esophagus, feline                            appearance, longitudinal furrows and white plaques                            were found in the entire esophagus. Esophageal                            findings were graded using the Eosinophilic                            Esophagitis Endoscopic Reference Score (EoE-EREFS)                            as: Edema Grade 1 Present (decreased clarity or                            absence of vascular markings), Rings Grade 2                            Moderate (distinct rings that do not occlude                            passage of diagnostic 8-10 mm endoscope), Exudates                            Grade 1 Mild (scattered white lesions involving                            less than 10 percent of the esophageal surface                            area), Furrows Grade 1  Present (vertical lines with                            or without visible depth) and Stricture present                            (1.6 cm luminal diameter). Biopsies were obtained                            from the proximal and distal esophagus with cold                            forceps for histology of suspected eosinophilic                            esophagitis. Verification of patient identification  for the specimen was done. Estimated blood loss was                            minimal.                           A 3 cm hiatal hernia was present.                           The exam was otherwise without abnormality.                           The cardia and gastric fundus were normal on                            retroflexion. Complications:            No immediate complications. Estimated Blood Loss:     Estimated blood loss was minimal. Impression:               - Benign-appearing esophageal stenosis. Dilated.                            Biopsied.                           - Esophageal mucosal changes consistent with                            eosinophilic esophagitis. Biopsied.                           - 3 cm hiatal hernia.                           - The examination was otherwise normal. Recommendation:           - Patient has a contact number available for                            emergencies. The signs and symptoms of potential                            delayed complications were discussed with the                            patient. Return to normal activities tomorrow.                            Written discharge instructions were provided to the                            patient.                           - Clear liquids x 1 hour then soft foods rest of  day. Start prior diet tomorrow.                           - Continue present medications.                           - Follow an antireflux regimen.                            - Await pathology results.                           - CC: Dr. Jeni Salles, MD 06/15/2016 11:48:07 AM This report has been signed electronically.

## 2016-06-15 NOTE — Progress Notes (Signed)
Called to room to assist during endoscopic procedure.  Patient ID and intended procedure confirmed with present staff. Received instructions for my participation in the procedure from the performing physician.  

## 2016-06-16 ENCOUNTER — Telehealth: Payer: Self-pay | Admitting: *Deleted

## 2016-06-16 NOTE — Telephone Encounter (Signed)
  Follow up Call-  Call back number 06/15/2016  Post procedure Call Back phone  # [phone]  Permission to leave phone message Yes  Some recent data might be hidden     Lm on number given we willl try back later today

## 2016-06-16 NOTE — Telephone Encounter (Signed)
  Follow up Call-  Call back number 06/15/2016  Post procedure Call Back phone  # [phone]  Permission to leave phone message Yes  Some recent data might be hidden     Patient questions:  Do you have a fever, pain , or abdominal swelling? No. Pain Score  0 *  Have you tolerated food without any problems? Yes.    Have you been able to return to your normal activities? Yes.    Do you have any questions about your discharge instructions: Diet   No. Medications  No. Follow up visit  No.  Do you have questions or concerns about your Care? No.  Actions: * If pain score is 4 or above: No action needed, pain <4.

## 2016-06-22 ENCOUNTER — Encounter: Payer: Self-pay | Admitting: Internal Medicine

## 2016-06-22 DIAGNOSIS — K200 Eosinophilic esophagitis: Secondary | ICD-10-CM

## 2016-06-22 HISTORY — DX: Eosinophilic esophagitis: K20.0

## 2016-06-22 NOTE — Progress Notes (Signed)
My Chart letter to patient No recall

## 2016-07-12 ENCOUNTER — Other Ambulatory Visit: Payer: Self-pay | Admitting: Obstetrics and Gynecology

## 2016-07-13 LAB — CYTOLOGY - PAP

## 2016-10-04 ENCOUNTER — Other Ambulatory Visit: Payer: Self-pay | Admitting: Physician Assistant

## 2017-01-10 ENCOUNTER — Other Ambulatory Visit: Payer: Self-pay | Admitting: Internal Medicine

## 2017-02-07 HISTORY — PX: ENDOMETRIAL ABLATION: SHX621

## 2017-03-12 ENCOUNTER — Other Ambulatory Visit: Payer: Self-pay | Admitting: Internal Medicine

## 2017-04-15 ENCOUNTER — Other Ambulatory Visit: Payer: Self-pay | Admitting: Internal Medicine

## 2017-05-15 ENCOUNTER — Other Ambulatory Visit: Payer: Self-pay | Admitting: Internal Medicine

## 2017-06-12 ENCOUNTER — Other Ambulatory Visit: Payer: Self-pay | Admitting: Internal Medicine

## 2017-08-24 ENCOUNTER — Other Ambulatory Visit: Payer: Self-pay | Admitting: Obstetrics and Gynecology

## 2017-08-24 DIAGNOSIS — R928 Other abnormal and inconclusive findings on diagnostic imaging of breast: Secondary | ICD-10-CM

## 2017-09-04 ENCOUNTER — Ambulatory Visit
Admission: RE | Admit: 2017-09-04 | Discharge: 2017-09-04 | Disposition: A | Discharge status: Home / Self Care | Payer: BLUE CROSS/BLUE SHIELD | Source: Ambulatory Visit | Attending: Obstetrics and Gynecology | Admitting: Obstetrics and Gynecology

## 2017-09-04 DIAGNOSIS — R928 Other abnormal and inconclusive findings on diagnostic imaging of breast: Secondary | ICD-10-CM

## 2017-10-26 ENCOUNTER — Telehealth: Payer: Self-pay | Admitting: *Deleted

## 2017-10-26 NOTE — Telephone Encounter (Signed)
REFILLED PROTONIX FOR PATIENT

## 2018-06-08 IMAGING — RF DG ESOPHAGUS
6 series · 14 of 18 positions shown · non-contrast
Comparison: None.

CLINICAL DATA: Pharyngo esophageal dysphagia

EXAM:
ESOPHOGRAM / BARIUM SWALLOW / BARIUM TABLET STUDY
TECHNIQUE: Combined double contrast and single contrast examination performed
using effervescent crystals, thick barium liquid, and thin barium
liquid. The patient was observed with fluoroscopy swallowing a 13 mm
barium sulphate tablet.
FLUOROSCOPY TIME:  Fluoroscopy Time:  1 minutes 54 seconds
Radiation Exposure Index (if provided by the fluoroscopic device):
99 mGy
Number of Acquired Spot Images: 0

[Series 1: sequence · 3 of 8 frames shown (1 of 5)]
[frame 2/8]
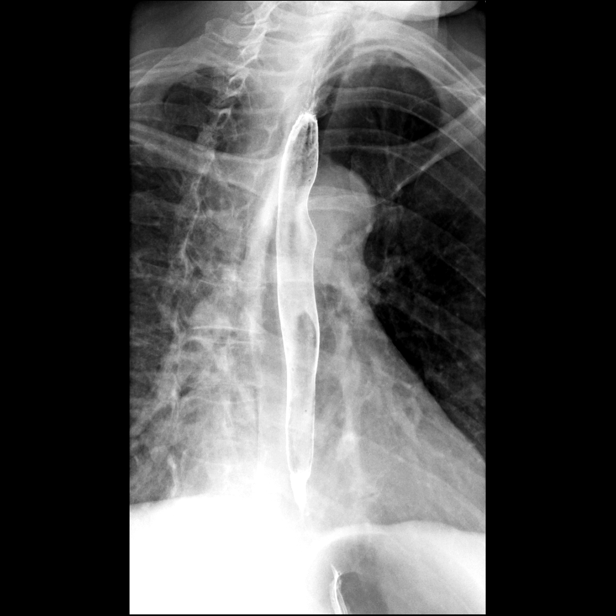
[frame 3/8]
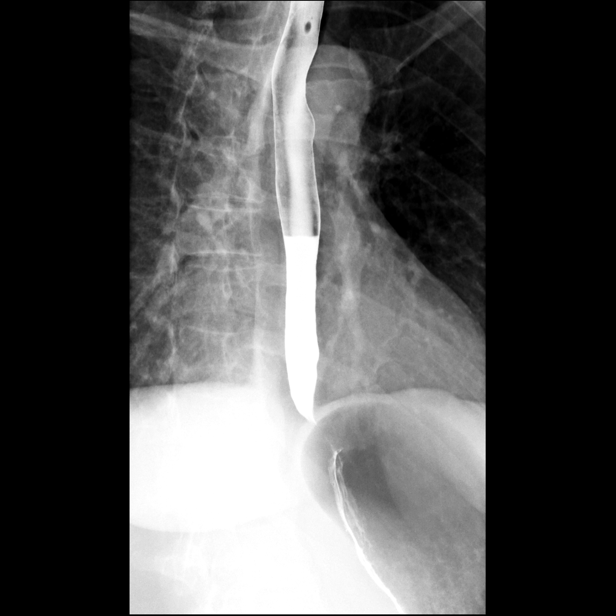
[frame 7/8]
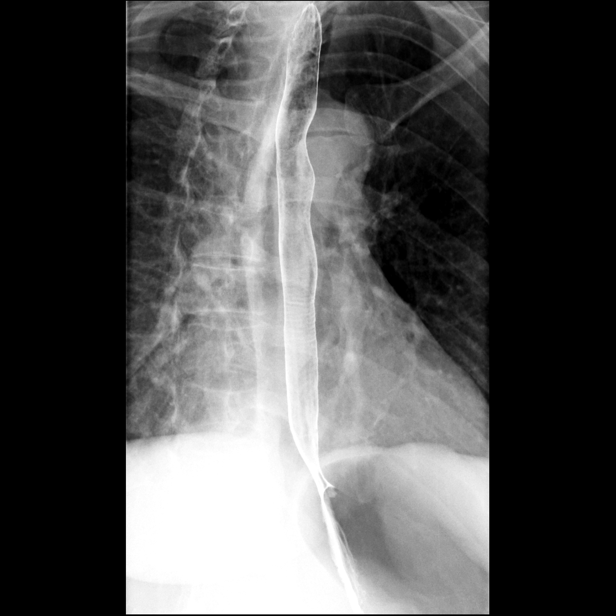

[Series 2: sequence · 2 of 18 frames shown (2 of 5)]
[frame 3/18]
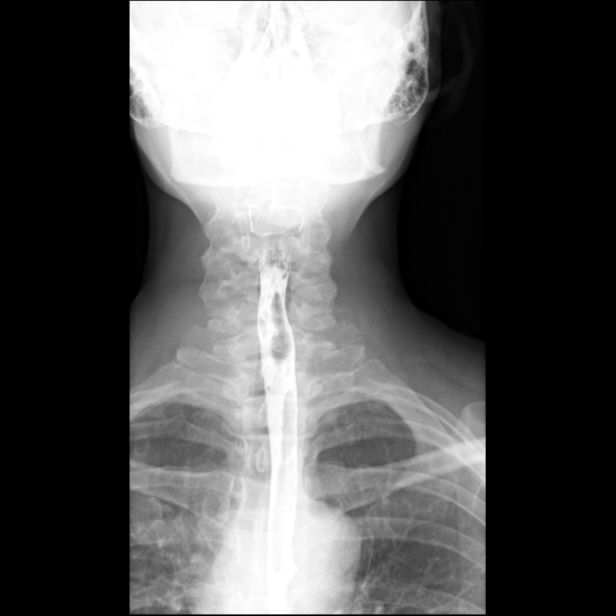
[frame 10/18]
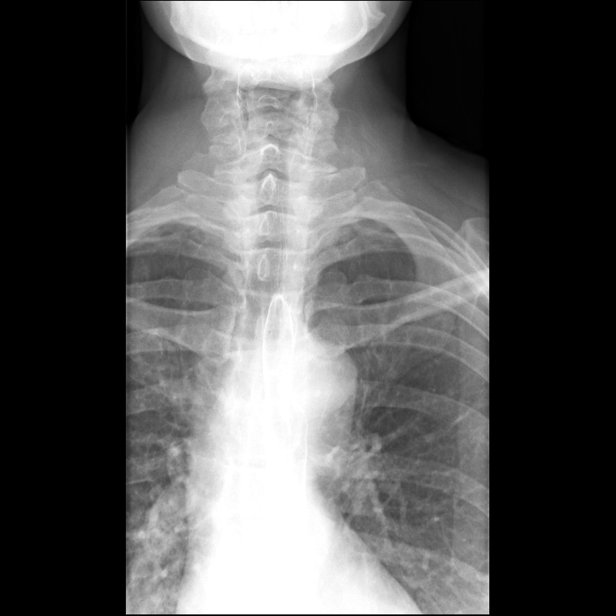

[Series 3: sequence · 3 of 9 frames shown (3 of 5)]
[frame 2/9]
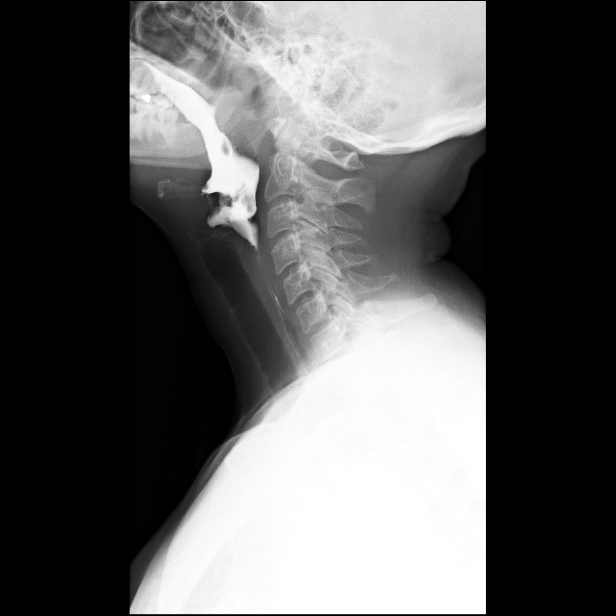
[frame 5/9]
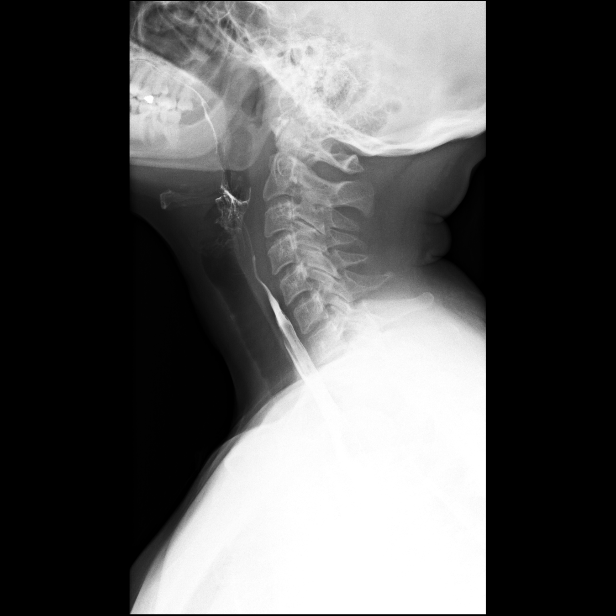
[frame 8/9]
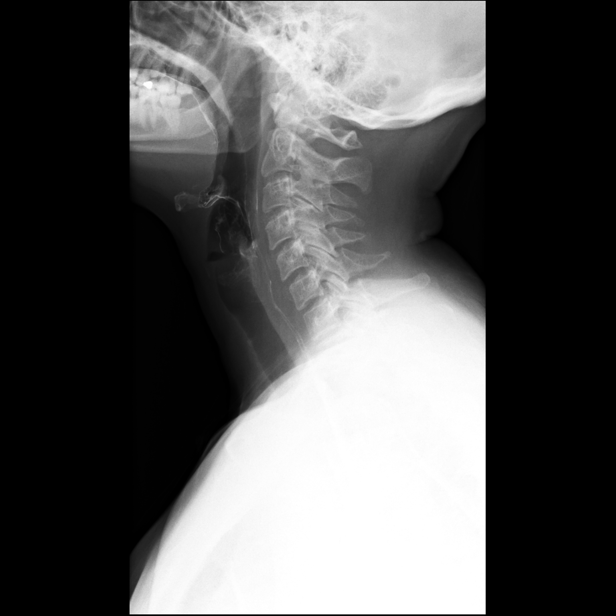

[Series 4: sequence · 3 of 50 frames shown (4 of 5)]
[frame 8/50]
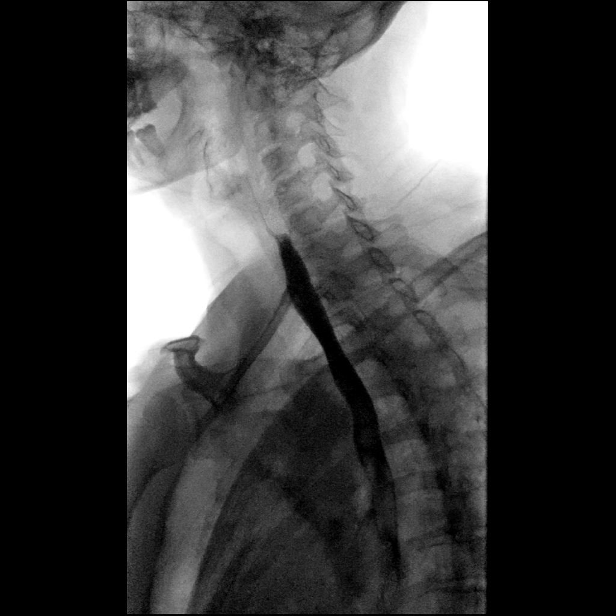
[frame 26/50]
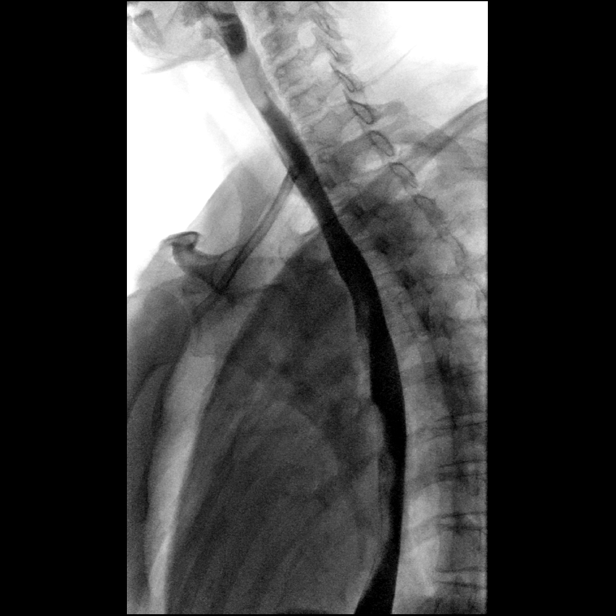
[frame 43/50]
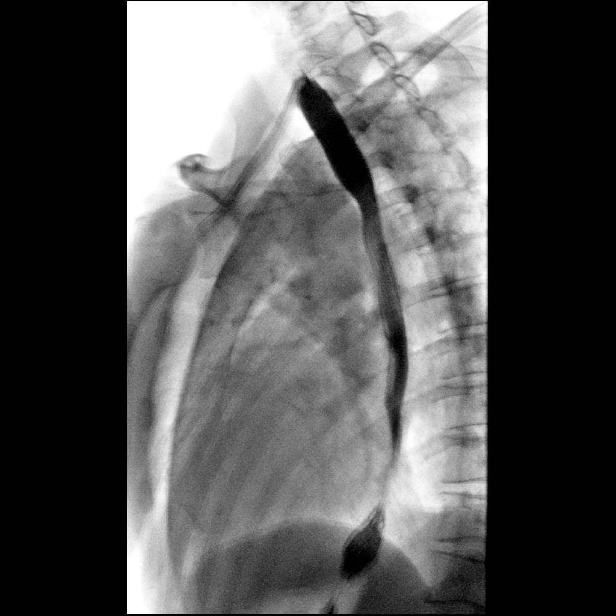

[Series 5: sequence · 2 of 20 frames shown (5 of 5)]
[frame 4/20]
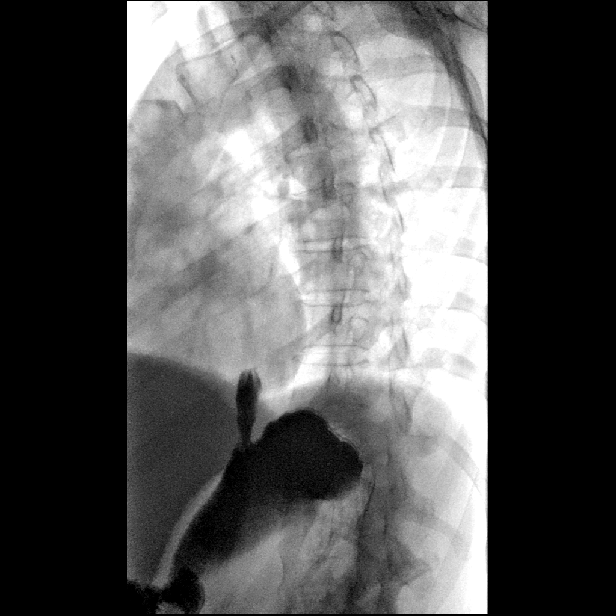
[frame 18/20]
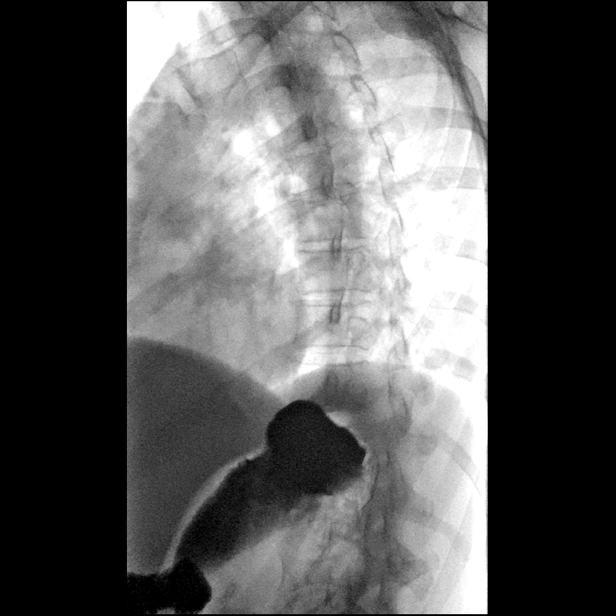

[Series 6: one shot · 1 of 1 slices shown]
[im 1/1]
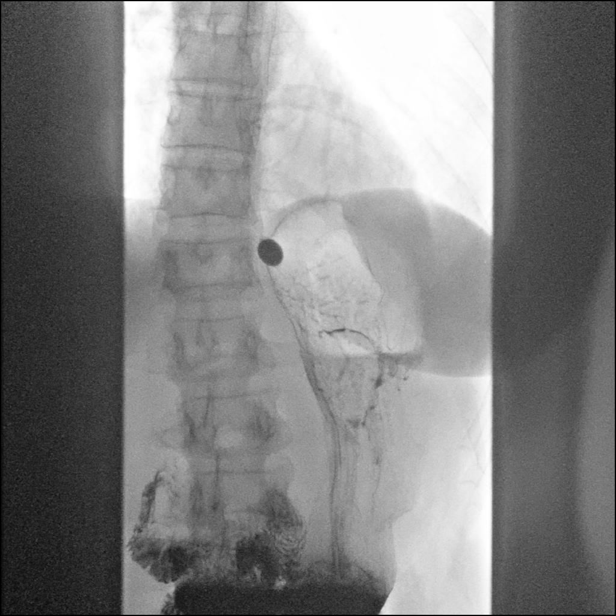

[14 of 18 positions shown; findings below may reference images not displayed]

FINDINGS: Initially a double-contrast barium swallow was performed. The mucosa
of the esophagus is unremarkable. Rapid sequence spot films of the
cervical esophagus show slight indentation upon the left lower
cervical esophagus possibly due to the previously demonstrated
thyroid nodules. Esophageal peristalsis is otherwise normal. No
hiatal hernia is seen. There is mild gastroesophageal reflux
demonstrated. A barium pill was given at the end the study. The
barium pill did lodge just above the gastroesophageal junction
suggesting a short segment distal esophageal stricture.
IMPRESSION: 1. Mild gastroesophageal reflux.
2. Barium pill lodges just above the gastroesophageal junction
suggesting a short segment distal esophageal stricture. No hiatal
hernia is seen.
3. Slight indentation upon the left lower cervical esophagus
possibly due to previously demonstrated thyroid nodules.

## 2018-07-10 IMAGING — US US THYROID
1 series · 13 of 25 positions shown · non-contrast
Comparison: None.

CLINICAL DATA: Palpable abnormality. 45-year-old female with
thyroid nodules on physical exam

EXAM:
THYROID ULTRASOUND
TECHNIQUE: Ultrasound examination of the thyroid gland and adjacent soft
tissues was performed.

[Series 1: us thyroid · 0.06mm/px · 13 of 58 slices shown]
[im 1/58]
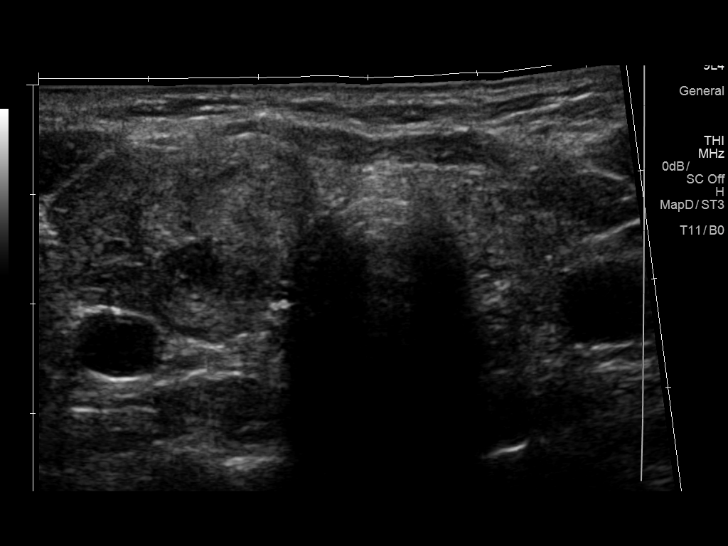
[im 5/58]
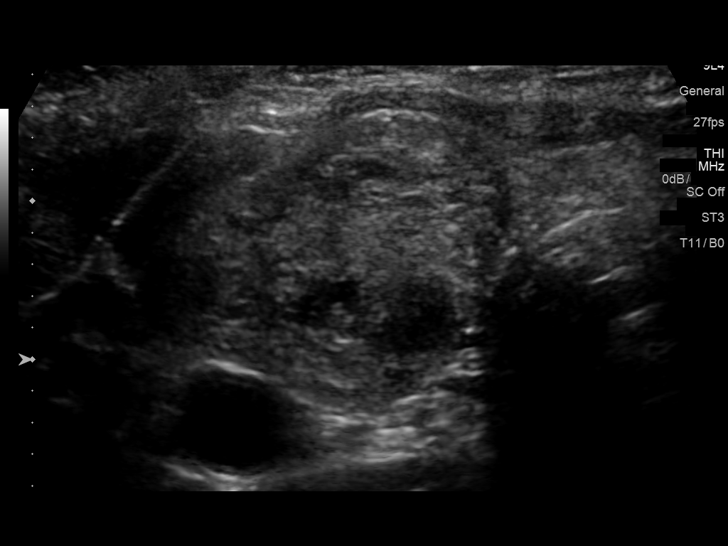
[im 10/58]
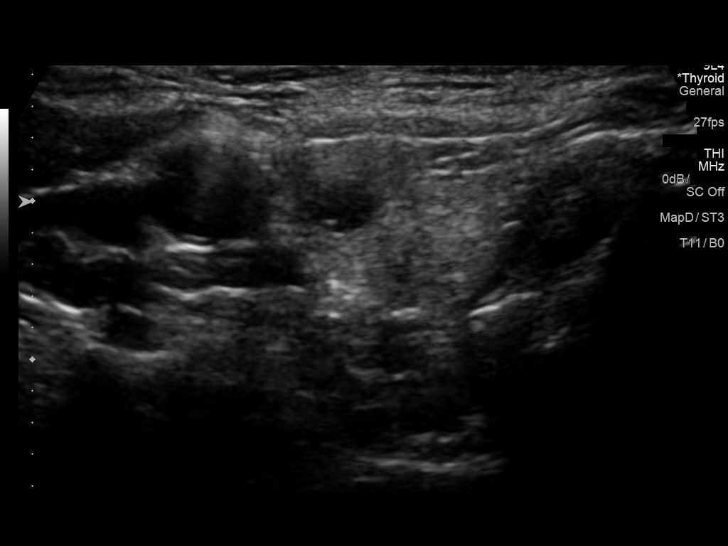
[im 15/58]
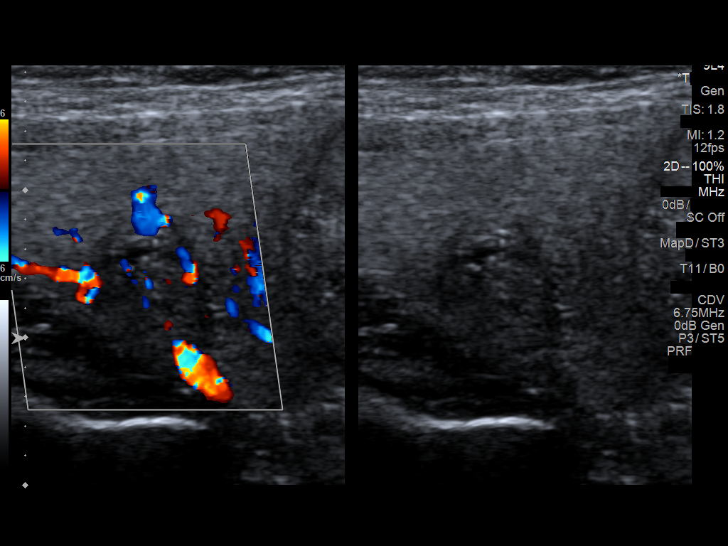
[im 20/58]
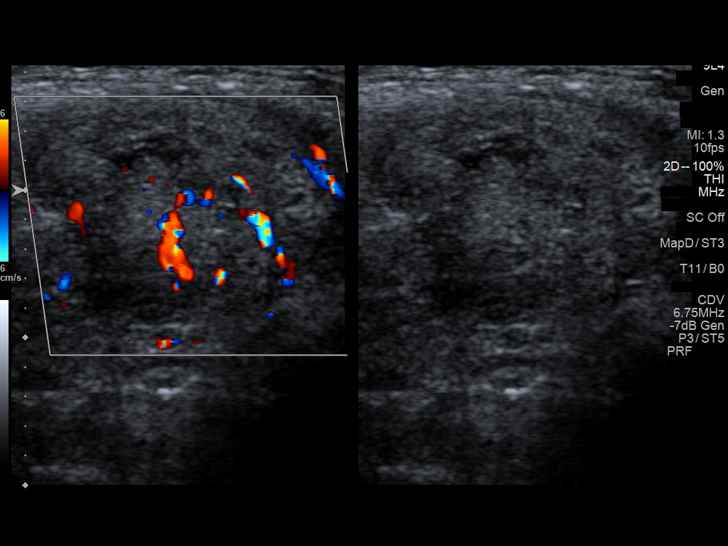
[im 24/58]
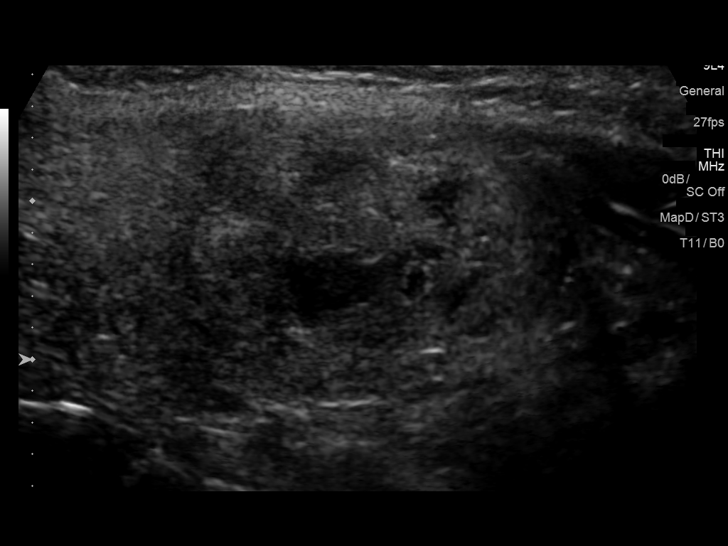
[im 29/58]
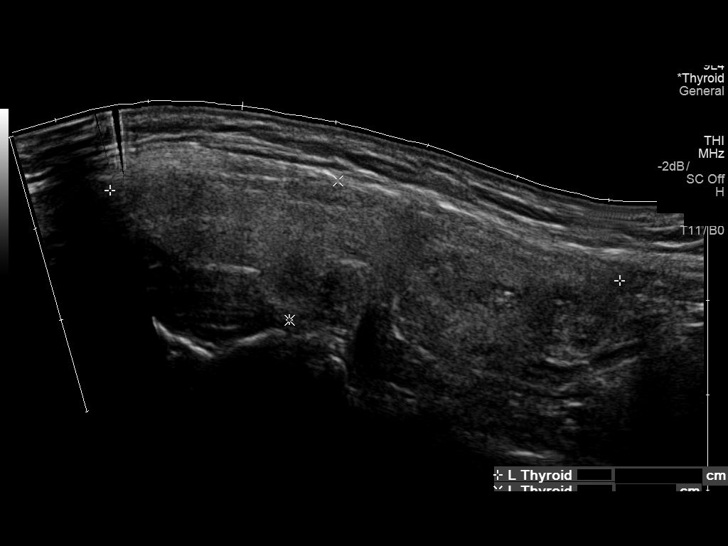
[im 34/58]
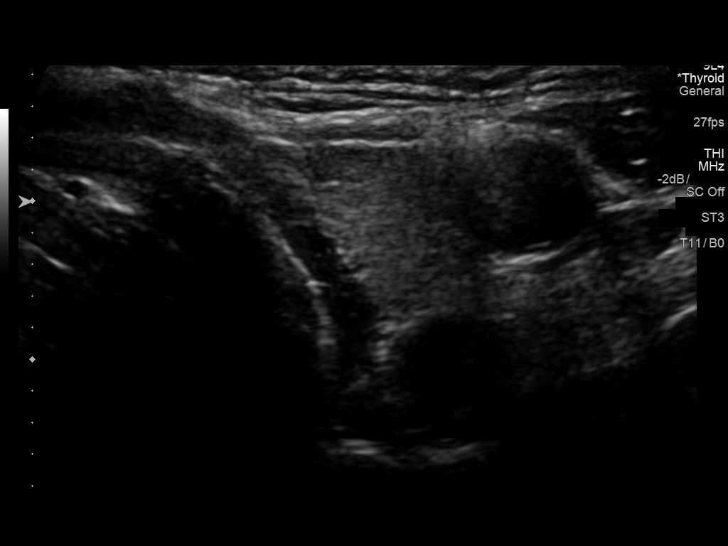
[im 39/58]
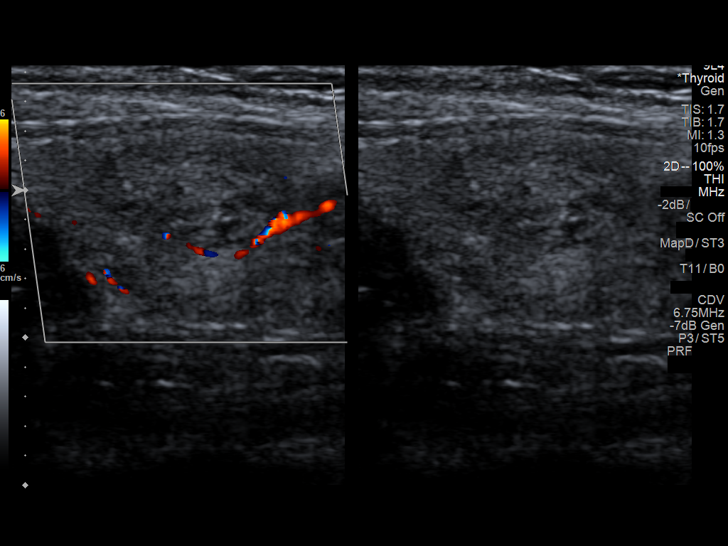
[im 43/58]
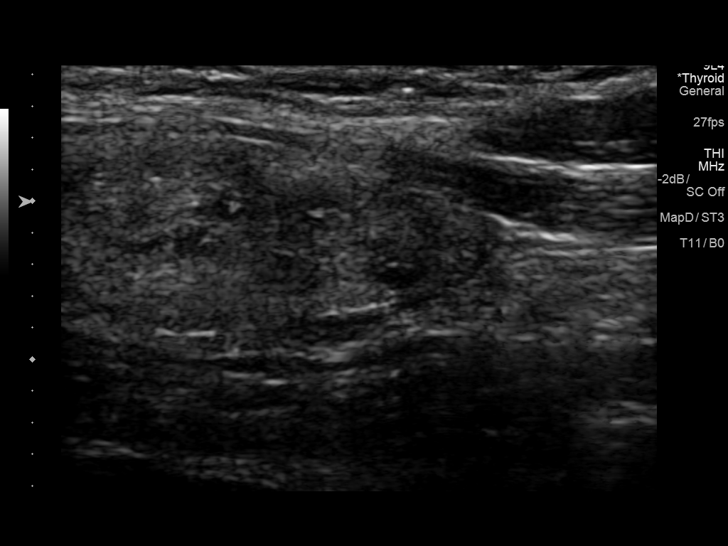
[im 48/58]
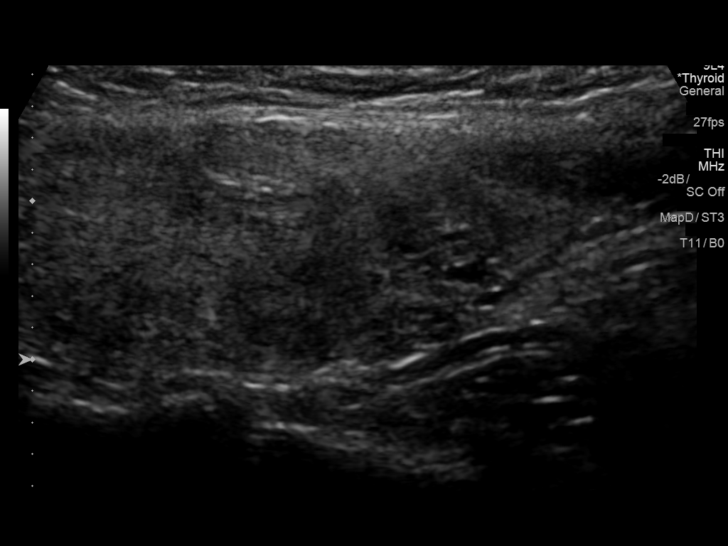
[im 53/58]
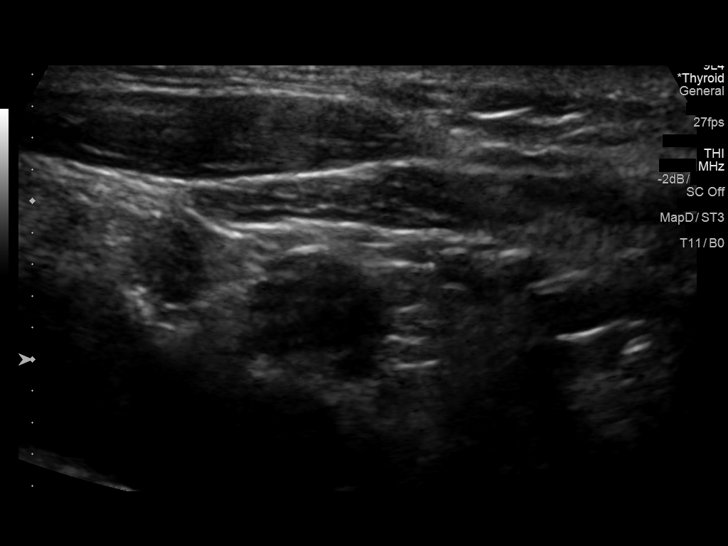
[im 58/58]
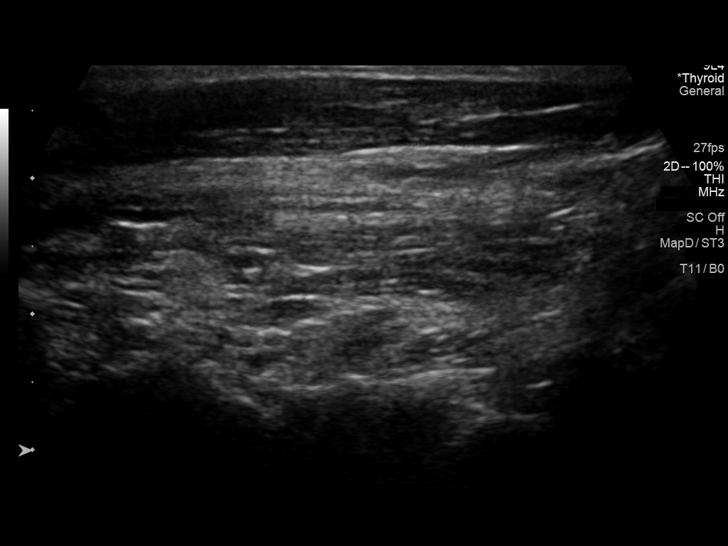

[13 of 25 positions shown; findings below may reference images not displayed]

FINDINGS: Parenchymal Echotexture: Markedly heterogenous

Isthmus: 0.4 cm

Right lobe: 6.9 x 1.9 x 2.5 cm

Left lobe: 5.4 x 1.6 x 1.9 cm

_________________________________________________________

Estimated total number of nodules >/= 1 cm: 4

Number of spongiform nodules >/=  2 cm not described below (TR1): 0

Number of mixed cystic and solid nodules >/= 1.5 cm not described
below (TR2): 0

_________________________________________________________

Nodule # 1:

Location: Right; Mid

Maximum size: 1.0 cm; Other 2 dimensions: 0.8 x 1.0 cm

Composition: solid/almost completely solid (2)

Echogenicity: isoechoic (1)

Shape: not taller-than-wide (0)

Margins: ill-defined (0)

Echogenic foci: none (0)

ACR TI-RADS total points: 3.

ACR TI-RADS risk category: TR3 (3 points).

ACR TI-RADS recommendations:

Given size (<1.4 cm) and appearance, this nodule does NOT meet
TI-RADS criteria for biopsy or dedicated follow-up.

_________________________________________________________

Nodule # 2:

Location: Right; Inferior

Maximum size: 2.2 cm; Other 2 dimensions: 2.2 x 1.7 cm

Composition: solid/almost completely solid (2)

Echogenicity: isoechoic (1)

Shape: not taller-than-wide (0)

Margins: ill-defined (0)

Echogenic foci: none (0)

ACR TI-RADS total points: 3.

ACR TI-RADS risk category: TR3 (3 points).

ACR TI-RADS recommendations:

*Given size (>/= 1.5 - 2.4 cm) and appearance, a follow-up
ultrasound in 1 year should be considered based on TI-RADS criteria.

_________________________________________________________

Additional left-sided TI-RADS category 3 nodules which do not meet
size criteria 2 require imaging follow-up or biopsy.
IMPRESSION: 1. Diffusely heterogeneous and enlarged thyroid gland with multiple
bilateral similar appearing TI-RADS 3 thyroid nodules. This
appearance is most consistent with a benign multinodular goiter.
2. The largest TI-RADS 3 nodule in the right inferior gland meets
criteria for continued annual ultrasound surveillance until 5 year
stability has been confirmed. The remaining nodules do not meet
criteria for dedicated imaging follow-up.
The above is in keeping with the ACR TI-RADS recommendations - [HOSPITAL] 2383;[DATE].

## 2019-01-07 ENCOUNTER — Other Ambulatory Visit: Payer: Self-pay | Admitting: Internal Medicine

## 2019-04-18 ENCOUNTER — Other Ambulatory Visit: Payer: Self-pay | Admitting: Internal Medicine

## 2019-05-02 ENCOUNTER — Ambulatory Visit (INDEPENDENT_AMBULATORY_CARE_PROVIDER_SITE_OTHER): Payer: BLUE CROSS/BLUE SHIELD | Admitting: Physician Assistant

## 2019-05-02 ENCOUNTER — Encounter: Payer: Self-pay | Admitting: Physician Assistant

## 2019-05-02 ENCOUNTER — Other Ambulatory Visit: Payer: Self-pay

## 2019-05-02 VITALS — BP 124/80 | HR 88 | Temp 97.8°F | Ht 69.0 in | Wt 159.4 lb

## 2019-05-02 DIAGNOSIS — K200 Eosinophilic esophagitis: Secondary | ICD-10-CM

## 2019-05-02 DIAGNOSIS — K219 Gastro-esophageal reflux disease without esophagitis: Secondary | ICD-10-CM

## 2019-05-02 MED ORDER — FLUTICASONE PROPIONATE HFA 220 MCG/ACT IN AERO: INHALATION_SPRAY | RESPIRATORY_TRACT | 3 refills | Status: DC

## 2019-05-02 MED ORDER — PANTOPRAZOLE SODIUM 40 MG PO TBEC: 40.0000 mg | DELAYED_RELEASE_TABLET | Freq: Every day | ORAL | 3 refills | Status: DC

## 2019-05-02 MED ORDER — FLUTICASONE PROPIONATE HFA 220 MCG/ACT IN AERO: INHALATION_SPRAY | RESPIRATORY_TRACT | 2 refills | Status: DC

## 2019-05-02 NOTE — Progress Notes (Signed)
Subjective:    Patient ID: Julia Blackwell, female    DOB: 04/09/1970, 49 y.o.   MRN: [id number]  HPI  Julia Blackwell is a pleasant 49 year old white female, established with Dr. Leone Blackwell.  She was last seen here in 2018 when she underwent upper endoscopy. She comes in today for refills of Protonix. Patient had initially presented in 2018 with complaints of dysphagia.  She had had a barium swallow prior to being referred that showed a barium tablet lodged just above the GE junction suggestive of a short stricture, no hiatal hernia and motility normal. She was started on Protonix for component of GERD and burning discomfort. She had EGD in May 2018 with finding of a 1.  6 cm distal stricture which was balloon dilated to 18 mm.  She was also noted to have a ringed appearing esophagus with feline appearance and long furrows consistent with eosinophilic esophagitis, also noted a 3 cm hiatal hernia.  Biopsies were consistent with eosinophilic esophagitis. I do not believe she was given any medication for this at that time. She says overall she has done very well on Protonix which controls her symptoms.  She has no complaints of dysphagia or odynophagia.  She says occasionally she will have some very vague dysphagia symptoms no heartburn or indigestion.  On further questioning she does frequently have a slight burning sensation in her chest which she says she has just gotten used to. She was unaware of the finding of eosinophilic esophagitis.  She says she does have significant seasonal allergies and takes Claritin regularly.  No food allergies that she is aware of.   Review of Systems Pertinent positive and negative review of systems were noted in the above HPI section.  All other review of systems was otherwise negative.  Outpatient Encounter Medications as of 05/02/2019  Medication Sig  . docusate sodium (COLACE) 100 MG capsule Take 100 mg by mouth daily as needed for mild constipation.  Marland Kitchen loratadine  (CLARITIN) 10 MG tablet Take 10 mg by mouth daily.  . naproxen sodium (ANAPROX) 220 MG tablet Take 220 mg by mouth 2 (two) times daily with a meal.  . pantoprazole (PROTONIX) 40 MG tablet TAKE 1 TABLET DAILY BEFORE BREAKFAST  . fluticasone (FLOVENT HFA) 220 MCG/ACT inhaler Spray one puff in mouth and swallow four times daily for 8 weeks  . pantoprazole (PROTONIX) 40 MG tablet Take 1 tablet (40 mg total) by mouth daily.  . [DISCONTINUED] ferrous sulfate 325 (65 FE) MG tablet Take 325 mg by mouth daily with breakfast.  . [DISCONTINUED] fluticasone (FLOVENT HFA) 220 MCG/ACT inhaler Spray one puff in mouth and swallow four times daily  . [DISCONTINUED] norgestimate-ethinyl estradiol (ORTHO-CYCLEN,SPRINTEC,PREVIFEM) 0.25-35 MG-MCG tablet Take 1 tablet by mouth daily.  . [DISCONTINUED] ranitidine (ZANTAC) 150 MG tablet Take 1 tablet (150 mg total) by mouth at bedtime as needed for heartburn.   No facility-administered encounter medications on file as of 05/02/2019.   No Known Allergies Patient Active Problem List   Diagnosis Date Noted  . Eosinophilic esophagitis 06/22/2016   Social History   Socioeconomic History  . Marital status: Married    Spouse name: Not on file  . Number of children: Not on file  . Years of education: Not on file  . Highest education level: Not on file  Occupational History  . Not on file  Tobacco Use  . Smoking status: Never Smoker  . Smokeless tobacco: Never Used  Substance and Sexual Activity  . Alcohol use: Yes  Comment: 1 drink daily  . Drug use: No  . Sexual activity: Yes    Birth control/protection: Pill  Other Topics Concern  . Not on file  Social History Narrative  . Not on file   Social Determinants of Health   Financial Resource Strain:   . Difficulty of Paying Living Expenses:   Food Insecurity:   . Worried About Charity fundraiser in the Last Year:   . Arboriculturist in the Last Year:   Transportation Needs:   . Film/video editor  (Medical):   Marland Kitchen Lack of Transportation (Non-Medical):   Physical Activity:   . Days of Exercise per Week:   . Minutes of Exercise per Session:   Stress:   . Feeling of Stress :   Social Connections:   . Frequency of Communication with Friends and Family:   . Frequency of Social Gatherings with Friends and Family:   . Attends Religious Services:   . Active Member of Clubs or Organizations:   . Attends Archivist Meetings:   Marland Kitchen Marital Status:   Intimate Partner Violence:   . Fear of Current or Ex-Partner:   . Emotionally Abused:   Marland Kitchen Physically Abused:   . Sexually Abused:     Ms. Sangster family history includes Breast cancer (age of onset: 89) in her maternal aunt; Breast cancer (age of onset: 31) in her mother; Colon cancer in an other family member; Diabetes in her father; Heart disease in her father.      Objective:    Vitals:   05/02/19 1332  BP: 124/80  Pulse: 88  Temp: 97.8 F (36.6 C)    Physical Exam Well-developed well-nourished WF  in no acute distress.  Height, Weight,159 BMI 23.5  HEENT; nontraumatic normocephalic, EOMI, PE RR LA, sclera anicteric. Oropharynx;not examined Neck; supple, no JVD   Neuro/Psych; alert and oriented x4, grossly nonfocal mood and affect appropriate       Assessment & Plan:   #12 49 year old white female with history of distal esophageal stricture status post balloon dilation 2018 comes in for routine follow-up today and refills. She has been maintained on Protonix 40 mg p.o. every morning for GERD and this has controlled her symptoms well. Patient was found to have eosinophilic esophagitis at the time of EGD, which has not been treated.  She is minimally symptomatic with frequent mild burning sensation in her chest.  #2 colon cancer surveillance-normal colonoscopy 2015/Dr. Mann-would plan follow-up 2025  Plan; continue Protonix 40 mg p.o. every morning, refill sent x1 year Continue antireflux regimen We discussed  diagnosis of eosinophilic esophagitis, and management, as well as tendency for intermittent exacerbations. We will go ahead and treat with fluticasone 220 mcg 2 sprays 4 times daily into the mouth and swallow x8 weeks. She may require intermittent courses. Consider allergy referral.   Alfredia Ferguson PA-C 05/02/2019   Cc: Bobbye Charleston, MD

## 2019-05-02 NOTE — Patient Instructions (Signed)
We have sent the following medications to your pharmacy for you to pick up at your convenience:  Protonix, Fluticasone

## 2019-05-06 ENCOUNTER — Telehealth: Payer: Self-pay

## 2019-05-06 NOTE — Telephone Encounter (Signed)
[facility] called to confirm Flovent rx - incorrect prescription sent in error.  Confirmed that it is 2 puffs 4 times a day in mouth and swallow for 8 weeks.

## 2019-09-04 ENCOUNTER — Other Ambulatory Visit: Payer: Self-pay

## 2019-09-04 DIAGNOSIS — N920 Excessive and frequent menstruation with regular cycle: Secondary | ICD-10-CM

## 2019-09-04 DIAGNOSIS — R079 Chest pain, unspecified: Secondary | ICD-10-CM | POA: Insufficient documentation

## 2019-09-04 DIAGNOSIS — R0789 Other chest pain: Secondary | ICD-10-CM | POA: Insufficient documentation

## 2019-09-04 DIAGNOSIS — R1900 Intra-abdominal and pelvic swelling, mass and lump, unspecified site: Secondary | ICD-10-CM | POA: Insufficient documentation

## 2019-09-04 DIAGNOSIS — R9431 Abnormal electrocardiogram [ECG] [EKG]: Secondary | ICD-10-CM | POA: Insufficient documentation

## 2019-09-04 DIAGNOSIS — L409 Psoriasis, unspecified: Secondary | ICD-10-CM

## 2019-09-04 DIAGNOSIS — R030 Elevated blood-pressure reading, without diagnosis of hypertension: Secondary | ICD-10-CM | POA: Insufficient documentation

## 2019-09-04 DIAGNOSIS — R195 Other fecal abnormalities: Secondary | ICD-10-CM

## 2019-09-04 HISTORY — DX: Intra-abdominal and pelvic swelling, mass and lump, unspecified site: R19.00

## 2019-09-04 HISTORY — DX: Psoriasis, unspecified: L40.9

## 2019-09-04 HISTORY — DX: Other fecal abnormalities: R19.5

## 2019-09-04 HISTORY — DX: Excessive and frequent menstruation with regular cycle: N92.0

## 2019-09-06 ENCOUNTER — Ambulatory Visit (INDEPENDENT_AMBULATORY_CARE_PROVIDER_SITE_OTHER): Payer: BLUE CROSS/BLUE SHIELD | Admitting: Cardiology

## 2019-09-06 ENCOUNTER — Other Ambulatory Visit: Payer: Self-pay

## 2019-09-06 ENCOUNTER — Other Ambulatory Visit: Payer: Self-pay | Admitting: *Deleted

## 2019-09-06 ENCOUNTER — Encounter: Payer: Self-pay | Admitting: Cardiology

## 2019-09-06 VITALS — BP 126/90 | HR 80 | Ht 69.0 in | Wt 153.1 lb

## 2019-09-06 DIAGNOSIS — R9431 Abnormal electrocardiogram [ECG] [EKG]: Secondary | ICD-10-CM

## 2019-09-06 DIAGNOSIS — R0789 Other chest pain: Secondary | ICD-10-CM | POA: Diagnosis not present

## 2019-09-06 DIAGNOSIS — R011 Cardiac murmur, unspecified: Secondary | ICD-10-CM

## 2019-09-06 DIAGNOSIS — I10 Essential (primary) hypertension: Secondary | ICD-10-CM

## 2019-09-06 DIAGNOSIS — K200 Eosinophilic esophagitis: Secondary | ICD-10-CM | POA: Diagnosis not present

## 2019-09-06 DIAGNOSIS — Z13220 Encounter for screening for lipoid disorders: Secondary | ICD-10-CM | POA: Insufficient documentation

## 2019-09-06 NOTE — Progress Notes (Signed)
Cardiology Office Note:    Date:  09/06/2019   ID:  Lajean Boese, DOB Jul 03, 1970, MRN [id number]  PCP:  Carrington Clamp, MD  Cardiologist:  Garwin Brothers, MD   Referring MD: Carrington Clamp, MD    ASSESSMENT:    1. Eosinophilic esophagitis   2. Abnormal EKG   3. Chest pressure   4. Murmur   5. Screening cholesterol level   6. Essential hypertension    PLAN:    In order of problems listed above:  1. Primary prevention stressed with the patient.  Importance of compliance with diet medication stressed and she vocalized understanding.  Importance of regular exercise stressed and I told her to exercise at least half an hour a day 5 days a week 2. Essential hypertension: Blood pressure stable and diet was emphasized 3. Cholesterol screening: We will be back in 3 months to see me before which she will have liver lipid check.  This is for his stratification.  She will also have calcium screening and she is agreeable with this. 4. Murmur: Echocardiogram will be done to assess murmur heard on auscultation. 5. Patient will be seen in follow-up appointment in 6 months or earlier if the patient has any concerns    Medication Adjustments/Labs and Tests Ordered: Current medicines are reviewed at length with the patient today.  Concerns regarding medicines are outlined above.  Orders Placed This Encounter  Procedures  . EKG 12-Lead   No orders of the defined types were placed in this encounter.    History of Present Illness:    Julia Blackwell is a 49 y.o. female who is being seen today for the evaluation of cardiac conditions at the request of Carrington Clamp, MD.  Patient is a pleasant 49 year old female.  She has past medical history of essential hypertension.  Patient mentions to me that she occasionally has chest discomfort but this is not related to exertion.  She uses her elliptical 1 or 2 times a week and this does not bring around any chest discomfort.  She is sexually  active without any symptoms of chest discomfort or shortness of breath.  She is very concerned about the symptoms and so she wanted to be evaluated and was referred.  At the time of my evaluation, the patient is alert awake oriented and in no distress.  Past Medical History:  Diagnosis Date  . Abnormal EKG   . Chest pressure   . Elevated blood pressure reading   . Eosinophilic esophagitis 06/22/2016  . Fecal occult blood test positive 09/04/2019  . Intermittent chest pain   . Menorrhagia 09/04/2019  . Pelvic mass 09/04/2019  . Pharyngoesophageal dysphagia 05/23/2016  . Psoriasis 09/04/2019  . Thyroid nodule 05/23/2016    Past Surgical History:  Procedure Laterality Date  . DILATION AND CURETTAGE OF UTERUS    . FOOT SURGERY Left     Current Medications: Current Meds  Medication Sig  . aspirin EC 81 MG tablet Take 81 mg by mouth daily. Swallow whole.  . docusate sodium (COLACE) 100 MG capsule Take 100 mg by mouth daily as needed for mild constipation.  Marland Kitchen lisinopril (ZESTRIL) 20 MG tablet Take 20 mg by mouth daily.  Marland Kitchen loratadine (CLARITIN) 10 MG tablet Take 10 mg by mouth daily.  . pantoprazole (PROTONIX) 40 MG tablet Take 1 tablet (40 mg total) by mouth daily.  . sertraline (ZOLOFT) 25 MG tablet Take 25 mg by mouth daily.     Allergies:   Patient has no  known allergies.   Social History   Socioeconomic History  . Marital status: Married    Spouse name: Not on file  . Number of children: Not on file  . Years of education: Not on file  . Highest education level: Not on file  Occupational History  . Not on file  Tobacco Use  . Smoking status: Never Smoker  . Smokeless tobacco: Never Used  Substance and Sexual Activity  . Alcohol use: Yes    Comment: 1 drink daily  . Drug use: No  . Sexual activity: Yes    Birth control/protection: Pill  Other Topics Concern  . Not on file  Social History Narrative  . Not on file   Social Determinants of Health   Financial Resource  Strain:   . Difficulty of Paying Living Expenses:   Food Insecurity:   . Worried About Programme researcher, broadcasting/film/video in the Last Year:   . Barista in the Last Year:   Transportation Needs:   . Freight forwarder (Medical):   Marland Kitchen Lack of Transportation (Non-Medical):   Physical Activity:   . Days of Exercise per Week:   . Minutes of Exercise per Session:   Stress:   . Feeling of Stress :   Social Connections:   . Frequency of Communication with Friends and Family:   . Frequency of Social Gatherings with Friends and Family:   . Attends Religious Services:   . Active Member of Clubs or Organizations:   . Attends Banker Meetings:   Marland Kitchen Marital Status:      Family History: The patient's family history includes Breast cancer (age of onset: 43) in her maternal aunt; Breast cancer (age of onset: 28) in her mother; Colon cancer in an other family member; Diabetes in her father; Heart disease in her father. There is no history of Esophageal cancer, Pancreatic cancer, or Stomach cancer.  ROS:   Please see the history of present illness.    All other systems reviewed and are negative.  EKGs/Labs/Other Studies Reviewed:    The following studies were reviewed today: I discussed my findings with the patient in extensive length EKG reveals sinus rhythm and nonspecific ST-T changes   Recent Labs: No results found for requested labs within last 8760 hours.  Recent Lipid Panel No results found for: CHOL, TRIG, HDL, CHOLHDL, VLDL, LDLCALC, LDLDIRECT  Physical Exam:    VS:  BP (!) 126/90   Pulse 80   Ht 5\' 9"  (1.753 m)   Wt 153 lb 1.3 oz (69.4 kg)   SpO2 100%   BMI 22.61 kg/m     Wt Readings from Last 3 Encounters:  09/06/19 153 lb 1.3 oz (69.4 kg)  05/02/19 159 lb 6 oz (72.3 kg)  06/15/16 166 lb (75.3 kg)     GEN: Patient is in no acute distress HEENT: Normal NECK: No JVD; No carotid bruits LYMPHATICS: No lymphadenopathy CARDIAC: S1 S2 regular, 2/6 systolic murmur  at the apex. RESPIRATORY:  Clear to auscultation without rales, wheezing or rhonchi  ABDOMEN: Soft, non-tender, non-distended MUSCULOSKELETAL:  No edema; No deformity  SKIN: Warm and dry NEUROLOGIC:  Alert and oriented x 3 PSYCHIATRIC:  Normal affect    Signed, 08/15/16, MD  09/06/2019 10:05 AM    [facility]

## 2019-09-06 NOTE — Patient Instructions (Signed)
Medication Instructions:  Your physician recommends that you continue on your current medications as directed. Please refer to the Current Medication list given to you today.  *If you need a refill on your cardiac medications before your next appointment, please call your pharmacy*   Lab Work: BEFORE YOUR NEXT APPT:  COME TO THE OFFICE ON MONDAYS, WEDNESDAYS, & FRIDAYS FROM 8:00-4:00 FOR FASTING LABS.  NOTHING TO EAT OR DRINK AFTER MIDNIGHT THE NIGHT BEFORE.  BMET, LFT, & LIPID  If you have labs (blood work) drawn today and your tests are completely normal, you will receive your results only by:  MyChart Message (if you have MyChart) OR  A paper copy in the mail If you have any lab test that is abnormal or we need to change your treatment, we will call you to review the results.   Testing/Procedures: Your physician has requested that you have an echocardiogram. Echocardiography is a painless test that uses sound waves to create images of your heart. It provides your doctor with information about the size and shape of your heart and how well your hearts chambers and valves are working. This procedure takes approximately one hour. There are no restrictions for this procedure.   Your physician recommends you have a Calcium Scoring Test.  This is done at our office, [facility], [address] [place], [place] [address]  [phone]   Follow-Up: At [facility], you and your health needs are our priority.  As part of our continuing mission to provide you with exceptional heart care, we have created designated Provider Care Teams.  These Care Teams include your primary Cardiologist (physician) and Advanced Practice Providers (APPs -  Physician Assistants and Nurse Practitioners) who all work together to provide you with the care you need, when you need it.  We recommend signing up for the patient portal called "MyChart".  Sign up information is provided on this After Visit Summary.   MyChart is used to connect with patients for Virtual Visits (Telemedicine).  Patients are able to view lab/test results, encounter notes, upcoming appointments, etc.  Non-urgent messages can be sent to your provider as well.   To learn more about what you can do with MyChart, go to [email or web address].    Your next appointment:   3 month(s)  The format for your next appointment:   In Person  Provider:   Belva Crome, MD   Other Instructions  Echocardiogram An echocardiogram is a procedure that uses painless sound waves (ultrasound) to produce an image of the heart. Images from an echocardiogram can provide important information about:  Signs of coronary artery disease (CAD).  Aneurysm detection. An aneurysm is a weak or damaged part of an artery wall that bulges out from the normal force of blood pumping through the body.  Heart size and shape. Changes in the size or shape of the heart can be associated with certain conditions, including heart failure, aneurysm, and CAD.  Heart muscle function.  Heart valve function.  Signs of a past heart attack.  Fluid buildup around the heart.  Thickening of the heart muscle.  A tumor or infectious growth around the heart valves. Tell a health care provider about:  Any allergies you have.  All medicines you are taking, including vitamins, herbs, eye drops, creams, and over-the-counter medicines.  Any blood disorders you have.  Any surgeries you have had.  Any medical conditions you have.  Whether you are pregnant or may be pregnant. What are the risks?  Generally, this is a safe procedure. However, problems may occur, including:  Allergic reaction to dye (contrast) that may be used during the procedure. What happens before the procedure? No specific preparation is needed. You may eat and drink normally. What happens during the procedure?   An IV tube may be inserted into one of your veins.  You may receive contrast  through this tube. A contrast is an injection that improves the quality of the pictures from your heart.  A gel will be applied to your chest.  A wand-like tool (transducer) will be moved over your chest. The gel will help to transmit the sound waves from the transducer.  The sound waves will harmlessly bounce off of your heart to allow the heart images to be captured in real-time motion. The images will be recorded on a computer. The procedure may vary among health care providers and hospitals. What happens after the procedure?  You may return to your normal, everyday life, including diet, activities, and medicines, unless your health care provider tells you not to do that. Summary  An echocardiogram is a procedure that uses painless sound waves (ultrasound) to produce an image of the heart.  Images from an echocardiogram can provide important information about the size and shape of your heart, heart muscle function, heart valve function, and fluid buildup around your heart.  You do not need to do anything to prepare before this procedure. You may eat and drink normally.  After the echocardiogram is completed, you may return to your normal, everyday life, unless your health care provider tells you not to do that. This information is not intended to replace advice given to you by your health care provider. Make sure you discuss any questions you have with your health care provider. Document Revised: 05/17/2018 Document Reviewed: 02/27/2016 Elsevier Patient Education  2020 Elsevier Inc.   Coronary Calcium Scan A coronary calcium scan is an imaging test used to look for deposits of plaque in the inner lining of the blood vessels of the heart (coronary arteries). Plaque is made up of calcium, protein, and fatty substances. These deposits of plaque can partly clog and narrow the coronary arteries without producing any symptoms or warning signs. This puts a person at risk for a heart  attack. This test is recommended for people who are at moderate risk for heart disease. The test can find plaque deposits before symptoms develop. Tell a health care provider about:  Any allergies you have.  All medicines you are taking, including vitamins, herbs, eye drops, creams, and over-the-counter medicines.  Any problems you or family members have had with anesthetic medicines.  Any blood disorders you have.  Any surgeries you have had.  Any medical conditions you have.  Whether you are pregnant or may be pregnant. What are the risks? Generally, this is a safe procedure. However, problems may occur, including:  Harm to a pregnant woman and her unborn baby. This test involves the use of radiation. Radiation exposure can be dangerous to a pregnant woman and her unborn baby. If you are pregnant or think you may be pregnant, you should not have this procedure done.  Slight increase in the risk of cancer. This is because of the radiation involved in the test. What happens before the procedure? Ask your health care provider for any specific instructions on how to prepare for this procedure. You may be asked to avoid products that contain caffeine, tobacco, or nicotine for 4 hours before the procedure.  What happens during the procedure?   You will undress and remove any jewelry from your neck or chest.  You will put on a hospital gown.  Sticky electrodes will be placed on your chest. The electrodes will be connected to an electrocardiogram (ECG) machine to record a tracing of the electrical activity of your heart.  You will lie down on a curved bed that is attached to the CT scanner.  You may be given medicine to slow down your heart rate so that clear pictures can be created.  You will be moved into the CT scanner, and the CT scanner will take pictures of your heart. During this time, you will be asked to lie still and hold your breath for 2-3 seconds at a time while each  picture of your heart is being taken. The procedure may vary among health care providers and hospitals. What happens after the procedure?  You can get dressed.  You can return to your normal activities.  It is up to you to get the results of your procedure. Ask your health care provider, or the department that is doing the procedure, when your results will be ready. Summary  A coronary calcium scan is an imaging test used to look for deposits of plaque in the inner lining of the blood vessels of the heart (coronary arteries). Plaque is made up of calcium, protein, and fatty substances.  Generally, this is a safe procedure. Tell your health care provider if you are pregnant or may be pregnant.  Ask your health care provider for any specific instructions on how to prepare for this procedure.  A CT scanner will take pictures of your heart.  You can return to your normal activities after the scan is done. This information is not intended to replace advice given to you by your health care provider. Make sure you discuss any questions you have with your health care provider. Document Revised: 08/14/2018 Document Reviewed: 08/14/2018 Elsevier Patient Education  2020 [employer].

## 2019-09-10 ENCOUNTER — Ambulatory Visit (HOSPITAL_COMMUNITY)
Admission: RE | Admit: 2019-09-10 | Discharge: 2019-09-10 | Disposition: A | Discharge status: Home / Self Care | Payer: BLUE CROSS/BLUE SHIELD | Source: Ambulatory Visit | Attending: Cardiology | Admitting: Cardiology

## 2019-09-10 ENCOUNTER — Telehealth: Payer: Self-pay | Admitting: Cardiology

## 2019-09-10 ENCOUNTER — Ambulatory Visit (INDEPENDENT_AMBULATORY_CARE_PROVIDER_SITE_OTHER)
Admission: RE | Admit: 2019-09-10 | Discharge: 2019-09-10 | Disposition: A | Discharge status: Home / Self Care | Payer: Self-pay | Source: Ambulatory Visit | Attending: Cardiology | Admitting: Cardiology

## 2019-09-10 ENCOUNTER — Other Ambulatory Visit: Payer: Self-pay

## 2019-09-10 DIAGNOSIS — R0789 Other chest pain: Secondary | ICD-10-CM | POA: Diagnosis not present

## 2019-09-10 DIAGNOSIS — I10 Essential (primary) hypertension: Secondary | ICD-10-CM

## 2019-09-10 DIAGNOSIS — R9431 Abnormal electrocardiogram [ECG] [EKG]: Secondary | ICD-10-CM | POA: Insufficient documentation

## 2019-09-10 DIAGNOSIS — R011 Cardiac murmur, unspecified: Secondary | ICD-10-CM | POA: Diagnosis not present

## 2019-09-10 DIAGNOSIS — Z13220 Encounter for screening for lipoid disorders: Secondary | ICD-10-CM

## 2019-09-10 LAB — ECHOCARDIOGRAM COMPLETE
Area-P 1/2: 4.21 cm2
S' Lateral: 2.8 cm

## 2019-09-10 NOTE — Telephone Encounter (Signed)
° ° °  Mandy from [place] physician would like to get copy of pt's notes from her last visit. She gave fax# [phone]

## 2019-09-10 NOTE — Telephone Encounter (Signed)
Faxed as requested via Epic.

## 2019-09-10 NOTE — Progress Notes (Signed)
  Echocardiogram 2D Echocardiogram has been performed.  Pieter Partridge 09/10/2019, 10:33 AM

## 2019-09-18 ENCOUNTER — Telehealth: Payer: Self-pay | Admitting: Cardiology

## 2019-09-18 ENCOUNTER — Telehealth: Payer: Self-pay

## 2019-09-18 NOTE — Telephone Encounter (Signed)
Pt would like to know what this means on her echo that she had done recently. Left ventricular diastolic parameters  are consistent with Grade II diastolic dysfunction (pseudonormalization).

## 2019-09-18 NOTE — Telephone Encounter (Signed)
Patient means that her heart is not relaxing completely normal.  It is not life-threatening however appropriate diet and regular exercise should help it.  She is also advised to keep track of her blood pressures and make sure they are within normal limits.

## 2019-09-18 NOTE — Telephone Encounter (Signed)
New Message  Patient calling in to go over echo results. Transferred call to [place].

## 2019-12-10 LAB — RESULTS CONSOLE HPV: CHL HPV: NEGATIVE

## 2019-12-10 LAB — HM PAP SMEAR: HM Pap smear: NORMAL

## 2019-12-11 ENCOUNTER — Ambulatory Visit: Payer: BLUE CROSS/BLUE SHIELD | Admitting: Cardiology

## 2020-01-22 ENCOUNTER — Telehealth: Payer: Self-pay | Admitting: Physician Assistant

## 2020-01-22 NOTE — Telephone Encounter (Signed)
Left message on her voice mail to call us back with the name of medicine to be refilled.

## 2020-01-22 NOTE — Telephone Encounter (Signed)
I spoke with Julia Blackwell and Julia Ba, PA-C  saw her in March and rx'ed Flovent. Patient was unable to get it due to the high cost. Now she has met her deductible and would like for [place] to send it in again to [facility] on [address]. In the office notes it said may require intermittent treatments. She gets coughing spells and thought this might help her. May I refill?

## 2020-01-22 NOTE — Telephone Encounter (Signed)
PJ this came to me but it is actually a [place] patient.

## 2020-01-22 NOTE — Telephone Encounter (Signed)
Patient is returning your call.  

## 2020-01-22 NOTE — Telephone Encounter (Signed)
Patient called to request refill however did not see medication listed please advise

## 2020-01-23 MED ORDER — FLUTICASONE PROPIONATE HFA 220 MCG/ACT IN AERO: INHALATION_SPRAY | RESPIRATORY_TRACT | 1 refills | Status: DC

## 2020-01-23 NOTE — Telephone Encounter (Signed)
rx sent and patient informed

## 2020-01-23 NOTE — Telephone Encounter (Signed)
I gave her Fluticasone , 2 sprays into mouth twice daily and swallow  x 8 weeks - this is for eosinophilic esophagitis  I am ok giving her a course as above- thanks

## 2020-03-26 ENCOUNTER — Other Ambulatory Visit: Payer: Self-pay | Admitting: Physician Assistant

## 2020-09-03 ENCOUNTER — Other Ambulatory Visit: Payer: Self-pay | Admitting: Physician Assistant

## 2020-12-02 ENCOUNTER — Other Ambulatory Visit (HOSPITAL_BASED_OUTPATIENT_CLINIC_OR_DEPARTMENT_OTHER): Payer: Self-pay

## 2020-12-02 ENCOUNTER — Ambulatory Visit: Payer: BLUE CROSS/BLUE SHIELD | Attending: Internal Medicine

## 2020-12-02 DIAGNOSIS — Z23 Encounter for immunization: Secondary | ICD-10-CM

## 2020-12-02 MED ORDER — MODERNA COVID-19 BIVAL BOOSTER 50 MCG/0.5ML IM SUSP
INTRAMUSCULAR | 0 refills | Status: DC
  Filled 2020-12-02 – 2020-12-03 (×2): qty 0.5, 1d supply, fill #0

## 2020-12-02 NOTE — Progress Notes (Signed)
   Covid-19 Vaccination Clinic  Name:  Julia Blackwell    MRN: [id number] DOB: [birth year]  12/02/2020  Ms. [name] was observed post Covid-19 immunization for 15 minutes without incident. She was provided with Vaccine Information Sheet and instruction to access the V-Safe system.   Ms. [name] was instructed to call 911 with any severe reactions post vaccine: Difficulty breathing  Swelling of face and throat  A fast heartbeat  A bad rash all over body  Dizziness and weakness   Immunizations Administered     Name Date Dose VIS Date Route   Moderna Covid-19 vaccine Bivalent Booster 12/02/2020  3:47 PM 0.5 mL 09/19/2020 Intramuscular   Manufacturer: Moderna   Lot: [id number]   NDC: 68032-122-48

## 2020-12-03 ENCOUNTER — Other Ambulatory Visit (HOSPITAL_BASED_OUTPATIENT_CLINIC_OR_DEPARTMENT_OTHER): Payer: Self-pay

## 2021-02-07 LAB — HM COLONOSCOPY

## 2021-02-23 DIAGNOSIS — Z01419 Encounter for gynecological examination (general) (routine) without abnormal findings: Secondary | ICD-10-CM | POA: Diagnosis not present

## 2021-02-23 DIAGNOSIS — Z803 Family history of malignant neoplasm of breast: Secondary | ICD-10-CM | POA: Diagnosis not present

## 2021-02-23 DIAGNOSIS — Z6822 Body mass index (BMI) 22.0-22.9, adult: Secondary | ICD-10-CM | POA: Diagnosis not present

## 2021-02-26 ENCOUNTER — Other Ambulatory Visit: Payer: Self-pay | Admitting: Physician Assistant

## 2021-04-01 DIAGNOSIS — I10 Essential (primary) hypertension: Secondary | ICD-10-CM | POA: Diagnosis not present

## 2021-04-01 DIAGNOSIS — F419 Anxiety disorder, unspecified: Secondary | ICD-10-CM | POA: Diagnosis not present

## 2021-05-27 ENCOUNTER — Other Ambulatory Visit: Payer: Self-pay | Admitting: Physician Assistant

## 2021-06-01 ENCOUNTER — Other Ambulatory Visit: Payer: Self-pay

## 2021-06-01 ENCOUNTER — Telehealth: Payer: Self-pay | Admitting: Physician Assistant

## 2021-06-01 MED ORDER — PANTOPRAZOLE SODIUM 40 MG PO TBEC: 40.0000 mg | DELAYED_RELEASE_TABLET | Freq: Every day | ORAL | 0 refills | Status: DC

## 2021-06-01 NOTE — Telephone Encounter (Signed)
Spoke with the patient. History of eosinophilic esophagitis (treated) and GERD. She has been on pantoprazole for several years now. She has not missed any doses and feels her symptoms are well controlled. Last seen in the office in 2021 and she is due follow up. Appointment is scheduled for 06/30/21. Confirmed her pharmacy as [facility] mail order pharmacy. Rx transmitted to the pharmacy. ?

## 2021-06-01 NOTE — Telephone Encounter (Signed)
Inbound call from patient stating she needs a refill for Protonix. Please advise.  

## 2021-06-30 ENCOUNTER — Encounter: Payer: Self-pay | Admitting: Physician Assistant

## 2021-06-30 ENCOUNTER — Ambulatory Visit (INDEPENDENT_AMBULATORY_CARE_PROVIDER_SITE_OTHER): Payer: BC Managed Care – PPO | Admitting: Physician Assistant

## 2021-06-30 VITALS — BP 126/90 | HR 76 | Ht 69.0 in | Wt 151.0 lb

## 2021-06-30 DIAGNOSIS — K625 Hemorrhage of anus and rectum: Secondary | ICD-10-CM

## 2021-06-30 DIAGNOSIS — R1314 Dysphagia, pharyngoesophageal phase: Secondary | ICD-10-CM | POA: Diagnosis not present

## 2021-06-30 DIAGNOSIS — K200 Eosinophilic esophagitis: Secondary | ICD-10-CM

## 2021-06-30 DIAGNOSIS — Z8719 Personal history of other diseases of the digestive system: Secondary | ICD-10-CM | POA: Diagnosis not present

## 2021-06-30 MED ORDER — PANTOPRAZOLE SODIUM 40 MG PO TBEC: 40.0000 mg | DELAYED_RELEASE_TABLET | Freq: Every day | ORAL | 3 refills | Status: DC

## 2021-06-30 NOTE — Progress Notes (Signed)
Subjective:    Patient ID: Julia Blackwell, female    DOB: 1970-06-03, 51 y.o.   MRN: [id number]  HPI  Julia Blackwell is a pleasant 51 year old white female, established with Dr. Carlean Blackwell.  She was last seen here about 2 years ago by myself, and comes in today with recurrent complaint of dysphagia.  She had initially presented in 2018 with complaints of dysphagia, underwent EGD with finding of a distal stricture which was balloon dilated and also noted to have a ringed appearing esophagus with feline appearance and long furrows consistent with eosinophilic esophagitis as well as a 3 cm hiatal hernia.  Biopsies were consistent with eosinophilic esophagitis.  When she was seen in 2021, she had been taking Protonix 40 mg daily consistently and had complaints of vague dysphagia, no regular heartburn or indigestion.  She was given a course of swallowed fluticasone x8 weeks.  She says she does not remember if this helped much but remembers that it was excessively expensive at $1800 for the 8-week course.. Currently she noticed initial recurrence of symptoms with an episode of dysphagia in October 2022 and says she has had 3-4 other episodes since then with food lodging to the point of having to regurgitate hard to expel the food.  In between these episodes she has had some milder episodes of dysphagia.  No heartburn indigestion and no complaints of abdominal pain.  She also mentions that she has been having intermittent bleeding over the past 6 months.  She thinks that she has had hemorrhoids in the past, is not certain that she has any definite external hemorrhoids but says that she has some burning and itching after bowel movements and notes bright red blood frequently on the tissue, not in the stool. She did have a colonoscopy in 2015 done per Dr. Collene Mares which was  a normal exam and we had initially planned to do follow-up at a 10-year interval. She does have a family history of colon polyps in her mother, no family  history of colon cancer.  Review of Systems. Pertinent positive and negative review of systems were noted in the above HPI section.  All other review of systems was otherwise negative.   Outpatient Encounter Medications as of 06/30/2021  Medication Sig   aspirin EC 81 MG tablet Take 81 mg by mouth every 4 (four) hours as needed. Swallow whole.   COVID-19 mRNA bivalent vaccine, Moderna, (MODERNA COVID-19 BIVAL BOOSTER) 50 MCG/0.5ML injection Inject into the muscle.   docusate sodium (COLACE) 100 MG capsule Take 100 mg by mouth daily as needed for mild constipation.   fluticasone (FLOVENT HFA) 220 MCG/ACT inhaler Spray two puffs in mouth and swallow twice daily for 8 weeks   lisinopril (ZESTRIL) 20 MG tablet Take 20 mg by mouth daily.   loratadine (CLARITIN) 10 MG tablet Take 10 mg by mouth daily.   sertraline (ZOLOFT) 25 MG tablet Take 25 mg by mouth daily.   [DISCONTINUED] pantoprazole (PROTONIX) 40 MG tablet Take 1 tablet (40 mg total) by mouth daily. Appt 06/30/21   pantoprazole (PROTONIX) 40 MG tablet Take 1 tablet (40 mg total) by mouth daily. Appt 06/30/21   No facility-administered encounter medications on file as of 06/30/2021.   No Known Allergies Patient Active Problem List   Diagnosis Date Noted   Murmur 09/06/2019   Screening cholesterol level 09/06/2019   Essential hypertension 09/06/2019   Fecal occult blood test positive 09/04/2019   Menorrhagia 09/04/2019   Pelvic mass 09/04/2019   Psoriasis 09/04/2019  Abnormal EKG    Chest pressure    Elevated blood pressure reading    Intermittent chest pain    Eosinophilic esophagitis [id number]   Pharyngoesophageal dysphagia 05/23/2016   Thyroid nodule 05/23/2016   Social History   Socioeconomic History   Marital status: Married    Spouse name: Not on file   Number of children: Not on file   Years of education: Not on file   Highest education level: Not on file  Occupational History   Not on file  Tobacco Use   Smoking  status: Never   Smokeless tobacco: Never  Substance and Sexual Activity   Alcohol use: Yes    Comment: 1 drink daily   Drug use: No   Sexual activity: Yes    Birth control/protection: Pill  Other Topics Concern   Not on file  Social History Narrative   Not on file   Social Determinants of Health   Financial Resource Strain: Not on file  Food Insecurity: Not on file  Transportation Needs: Not on file  Physical Activity: Not on file  Stress: Not on file  Social Connections: Not on file  Intimate Partner Violence: Not on file    Julia Blackwell's family history includes Breast cancer (age of onset: 72) in her maternal aunt; Breast cancer (age of onset: 31) in her mother; Colon cancer in an other family member; Diabetes in her father; Heart disease in her father.      Objective:    Vitals:   06/30/21 1430  BP: 126/90  Pulse: 76    Physical Exam Well-developed well-nourished WF in no acute distress.  Height, Weight, 151 BMI 22.3  HEENT; nontraumatic normocephalic, EOMI, PE R LA, sclera anicteric. Oropharynx; not examined Neck; supple, no JVD Cardiovascular; regular rate and rhythm with S1-S2, no murmur rub or gallop Pulmonary; Clear bilaterally Abdomen; soft, nontender, nondistended, no palpable mass or hepatosplenomegaly, bowel sounds are active Rectal;not done Skin; benign exam, no jaundice rash or appreciable lesions Extremities; no clubbing cyanosis or edema skin warm and dry Neuro/Psych; alert and oriented x4, grossly nonfocal mood and affect appropriate        Assessment & Plan:   #65 51 year old female with history of recurrent esophageal stricture, and eosinophilic esophagitis who comes in today with complaints of recurrent solid food dysphagia with intermittent episodes requiring regurgitation over the past 7 months.  No ongoing heartburn or indigestion. She has been maintained on Protonix 40 mg p.o. daily She was given 1 course of fluticasone in [id number] but is  not certain how much difference that made in her symptoms and relates that it was quite expensive.  Current symptoms are consistent with recurrent distal esophageal stricture  #2 rectal bleeding intermittent x6 months, has noted blood primarily on the tissue, and associated burning and itching She did have 1 prior colonoscopy done in 2015 per Dr. Collene Mares which was normal  Current symptoms may be secondary to internal hemorrhoids, as last colonoscopy was almost 9 years ago, need to consider other sources for rectal bleeding i.e. neoplasm, polyps, proctitis etc.  #3 chronic cough-is on Zestril, can be associated with chronic cough #4 hypertension  Plan; patient will be scheduled for upper endoscopy with probable esophageal dilation and colonoscopy with Dr. Carlean Blackwell.  Both procedures were discussed in detail with the patient including indications risk and benefits and she is agreeable to proceed  Refill Protonix 40 mg p.o. every morning x1 year We will hold on prescribing further fluticasone or budesonide until findings  at EGD Advised trial of RectiCare complete 2-3 times daily for anorectal symptoms till we have a definite diagnosis at the time of colonoscopy.  Pearline Yerby Genia Harold PA-C 06/30/2021   Cc: [facility]

## 2021-06-30 NOTE — Patient Instructions (Signed)
If you are age 49 or younger, your body mass index should be between 19-25. Your Body mass index is 22.3 kg/m. If this is out of the aformentioned range listed, please consider follow up with your Primary Care Provider.  ________________________________________________________  The [facility] providers would like to encourage you to use [facility] to communicate with providers for non-urgent requests or questions.  Due to long hold times on the telephone, sending your provider a message by [facility] may be a faster and more efficient way to get a response.  Please allow 48 business hours for a response.  Please remember that this is for non-urgent requests.  _______________________________________________________  Bonita Quin have been scheduled for an endoscopy and colonoscopy. Please follow the written instructions given to you at your visit today. Please pick up your prep supplies at the pharmacy within the next 1-3 days. If you use inhalers (even only as needed), please bring them with you on the day of your procedure.  Refills of Pantoprazole have been sent to your pharmacy.  Try using Recticare Complete/Advanced 2-3 times daily for rectal symptoms.  Follow up pending the results of your procedures.  Thank you for entrusting me with your care and choosing [facility].  Amy Esterwood, PA-C

## 2021-07-30 ENCOUNTER — Encounter: Payer: Self-pay | Admitting: Internal Medicine

## 2021-08-05 ENCOUNTER — Encounter: Payer: Self-pay | Admitting: Internal Medicine

## 2021-08-05 ENCOUNTER — Ambulatory Visit (AMBULATORY_SURGERY_CENTER): Payer: BC Managed Care – PPO | Admitting: Internal Medicine

## 2021-08-05 VITALS — BP 120/70 | HR 70 | Temp 98.6°F | Resp 8 | Ht 69.0 in | Wt 151.0 lb

## 2021-08-05 DIAGNOSIS — K200 Eosinophilic esophagitis: Secondary | ICD-10-CM | POA: Diagnosis not present

## 2021-08-05 DIAGNOSIS — K648 Other hemorrhoids: Secondary | ICD-10-CM

## 2021-08-05 DIAGNOSIS — K625 Hemorrhage of anus and rectum: Secondary | ICD-10-CM | POA: Diagnosis not present

## 2021-08-05 DIAGNOSIS — K317 Polyp of stomach and duodenum: Secondary | ICD-10-CM

## 2021-08-05 DIAGNOSIS — R1314 Dysphagia, pharyngoesophageal phase: Secondary | ICD-10-CM | POA: Diagnosis not present

## 2021-08-05 MED ORDER — SODIUM CHLORIDE 0.9 % IV SOLN: 500.0000 mL | Freq: Once | INTRAVENOUS | Status: DC

## 2021-08-05 NOTE — Patient Instructions (Addendum)
It looks like the eosinophilic esophagitis is active. I took biopsies and also dilated the esophagus. Your swallowing should be better. You may experience some pain with swallowing that should get better in a couple of days.  There were some small stomach polyps that I biopsied - they look like benign fundic gland polyps that do not need follow-up.  The colonoscopy was ok - hemorrhoids seen but all else ok.  I appreciate the opportunity to care for you. Iva Boop, MD, [facility]   Clear liquids for 1 hour then soft foods the rest of day. (See handout.)  Start prior diet tomorrow.   Handout on hemorrhoids and polyps given.   YOU HAD AN ENDOSCOPIC PROCEDURE TODAY AT THE  ENDOSCOPY CENTER:   Refer to the procedure report that was given to you for any specific questions about what was found during the examination.  If the procedure report does not answer your questions, please call your gastroenterologist to clarify.  If you requested that your care partner not be given the details of your procedure findings, then the procedure report has been included in a sealed envelope for you to review at your convenience later.  YOU SHOULD EXPECT: Some feelings of bloating in the abdomen. Passage of more gas than usual.  Walking can help get rid of the air that was put into your GI tract during the procedure and reduce the bloating. If you had a lower endoscopy (such as a colonoscopy or flexible sigmoidoscopy) you may notice spotting of blood in your stool or on the toilet paper. If you underwent a bowel prep for your procedure, you may not have a normal bowel movement for a few days.  Please Note:  You might notice some irritation and congestion in your nose or some drainage.  This is from the oxygen used during your procedure.  There is no need for concern and it should clear up in a day or so.  SYMPTOMS TO REPORT IMMEDIATELY:  Following lower endoscopy (colonoscopy or flexible  sigmoidoscopy):  Excessive amounts of blood in the stool  Significant tenderness or worsening of abdominal pains  Swelling of the abdomen that is new, acute  Fever of 100F or higher  Following upper endoscopy (EGD)  Vomiting of blood or coffee ground material  New chest pain or pain under the shoulder blades  Painful or persistently difficult swallowing  New shortness of breath  Fever of 100F or higher  Black, tarry-looking stools  For urgent or emergent issues, a gastroenterologist can be reached at any hour by calling [phone]. Do not use MyChart messaging for urgent concerns.    DIET:  We do recommend a small meal at first, but then you may proceed to your regular diet.  Drink plenty of fluids but you should avoid alcoholic beverages for 24 hours.  ACTIVITY:  You should plan to take it easy for the rest of today and you should NOT DRIVE or use heavy machinery until tomorrow (because of the sedation medicines used during the test).    FOLLOW UP: Our staff will call the number listed on your records the next business day following your procedure.  We will call around 7:15- 8:00 am to check on you and address any questions or concerns that you may have regarding the information given to you following your procedure. If we do not reach you, we will leave a message.  If you develop any symptoms (ie: fever, flu-like symptoms, shortness of breath, cough etc.)  before then, please call [phone].  If you test positive for Covid 19 in the 2 weeks post procedure, please call and report this information to [place].    If any biopsies were taken you will be contacted by phone or by letter within the next 1-3 weeks.  Please call us at [phone] if you have not heard about the biopsies in 3 weeks.    SIGNATURES/CONFIDENTIALITY: You and/or your care partner have signed paperwork which will be entered into your electronic medical record.  These signatures attest to the fact that that the  information above on your After Visit Summary has been reviewed and is understood.  Full responsibility of the confidentiality of this discharge information lies with you and/or your care-partner.

## 2021-08-05 NOTE — Progress Notes (Signed)
VSS, transported to PACU °

## 2021-08-05 NOTE — Progress Notes (Signed)
Called to room to assist during endoscopic procedure.  Patient ID and intended procedure confirmed with present staff. Received instructions for my participation in the procedure from the performing physician.  

## 2021-08-05 NOTE — Progress Notes (Signed)
[facility] History and Physical   Primary Care Physician:  [facility] [place]   Reason for Procedure:   Dysphagia, rectal bleeding  Plan:    EGD and colonoscopy     HPI: Julia Blackwell is a 51 y.o. female with recurrent complaint of dysphagia.  She had initially presented in 2018 with complaints of dysphagia, underwent EGD with finding of a distal stricture which was balloon dilated and also noted to have a ringed appearing esophagus with feline appearance and long furrows consistent with eosinophilic esophagitis as well as a 3 cm hiatal hernia.  Biopsies were consistent with eosinophilic esophagitis.  When she was seen in 2021, she had been taking Protonix 40 mg daily consistently and had complaints of vague dysphagia, no regular heartburn or indigestion.  She was given a course of swallowed fluticasone x8 weeks.  She says she does not remember if this helped much but remembers that it was excessively expensive at $1800 for the 8-week course.. Currently she noticed initial recurrence of symptoms with an episode of dysphagia in October 2022 and says she has had 3-4 other episodes since then with food lodging to the point of having to regurgitate hard to expel the food.  In between these episodes she has had some milder episodes of dysphagia.  No heartburn indigestion and no complaints of abdominal pain.  She also mentions that she has been having intermittent bleeding over the past 6 months.  She thinks that she has had hemorrhoids in the past, is not certain that she has any definite external hemorrhoids but says that she has some burning and itching after bowel movements and notes bright red blood frequently on the tissue, not in the stool. She did have a colonoscopy in 2015 done per Dr. Loreta Ave which was  a normal exam and we had initially planned to do follow-up at a 10-year interval. She does have a family history of colon polyps in her mother, no family history  of colon cancer.    Past Medical History:  Diagnosis Date   Abnormal EKG    Allergy    Anemia    with pregnancy   Chest pressure    Elevated blood pressure reading    Eosinophilic esophagitis 06/22/2016   Fecal occult blood test positive 09/04/2019   GERD (gastroesophageal reflux disease)    Hypertension    Intermittent chest pain    Menorrhagia 09/04/2019   Pelvic mass 09/04/2019   Pharyngoesophageal dysphagia 05/23/2016   Psoriasis 09/04/2019   Thyroid nodule 05/23/2016    Past Surgical History:  Procedure Laterality Date   COLONOSCOPY     DILATION AND CURETTAGE OF UTERUS     ENDOMETRIAL ABLATION  2019   FOOT SURGERY Left    UPPER GASTROINTESTINAL ENDOSCOPY      Prior to Admission medications   Medication Sig Start Date End Date Taking? Authorizing Provider  docusate sodium (COLACE) 100 MG capsule Take 100 mg by mouth daily as needed for mild constipation.   Yes [provider]  lisinopril-hydrochlorothiazide (ZESTORETIC) 20-12.5 MG tablet Take 1 tablet by mouth daily. 05/27/21  Yes [provider]  NON FORMULARY Amberien-OTC for menopause   Yes [provider]  pantoprazole (PROTONIX) 40 MG tablet Take 1 tablet (40 mg total) by mouth daily. Appt 06/30/21 06/30/21  Yes Esterwood, Amy S, PA-C  sertraline (ZOLOFT) 50 MG tablet Take 50 mg by mouth daily. 07/26/21  Yes [provider]  zinc gluconate 50 MG tablet Take 50  mg by mouth daily.   Yes [provider]  clobetasol ointment (TEMOVATE) 0.05 % APPLY 1 A SMALL AMOUNT TO AFFECTED AREA TWICE A DAY 10/12/18   [provider]  fluticasone (FLOVENT HFA) 220 MCG/ACT inhaler Spray two puffs in mouth and swallow twice daily for 8 weeks 01/23/20   Esterwood, Amy S, PA-C  loratadine (CLARITIN) 10 MG tablet Take 10 mg by mouth daily.    [provider]  naproxen sodium (ALEVE) 220 MG tablet Take by mouth.    [provider]    Current Outpatient Medications   Medication Sig Dispense Refill   docusate sodium (COLACE) 100 MG capsule Take 100 mg by mouth daily as needed for mild constipation.     lisinopril-hydrochlorothiazide (ZESTORETIC) 20-12.5 MG tablet Take 1 tablet by mouth daily.     NON FORMULARY Amberien-OTC for menopause     pantoprazole (PROTONIX) 40 MG tablet Take 1 tablet (40 mg total) by mouth daily. Appt 06/30/21 90 tablet 3   sertraline (ZOLOFT) 50 MG tablet Take 50 mg by mouth daily.     zinc gluconate 50 MG tablet Take 50 mg by mouth daily.     clobetasol ointment (TEMOVATE) 0.05 % APPLY 1 A SMALL AMOUNT TO AFFECTED AREA TWICE A DAY     fluticasone (FLOVENT HFA) 220 MCG/ACT inhaler Spray two puffs in mouth and swallow twice daily for 8 weeks 1 each 1   loratadine (CLARITIN) 10 MG tablet Take 10 mg by mouth daily.     naproxen sodium (ALEVE) 220 MG tablet Take by mouth.     Current Facility-Administered Medications  Medication Dose Route Frequency Provider Last Rate Last Admin   0.9 %  sodium chloride infusion  500 mL Intravenous Once Iva Boop, MD        Allergies as of 08/05/2021   (No Known Allergies)    Family History  Problem Relation Age of Onset   Breast cancer Mother 8   Diabetes Father    Heart disease Father    Breast cancer Maternal Aunt 63   Colon cancer Other        PGGF   Esophageal cancer Neg Hx    Pancreatic cancer Neg Hx    Stomach cancer Neg Hx    Rectal cancer Neg Hx     Social History   Socioeconomic History   Marital status: Married    Spouse name: Not on file   Number of children: Not on file   Years of education: Not on file   Highest education level: Not on file  Occupational History   Not on file  Tobacco Use   Smoking status: Never   Smokeless tobacco: Never  Vaping Use   Vaping Use: Never used  Substance and Sexual Activity   Alcohol use: Yes    Comment: 1 drink daily   Drug use: No   Sexual activity: Yes    Birth control/protection: Other-see comments    Comment:  husband has vesectomy  Other Topics Concern   Not on file  Social History Narrative   Not on file   Social Determinants of Health   Financial Resource Strain: Not on file  Food Insecurity: Not on file  Transportation Needs: Not on file  Physical Activity: Not on file  Stress: Not on file  Social Connections: Not on file  Intimate Partner Violence: Not on file    Review of Systems:  All other review of systems negative except as mentioned in the  HPI.  Physical Exam: Vital signs BP 109/72   Pulse 93   Temp 98.6 F (37 C) (Temporal)   Ht 5\' 9"  (1.753 m)   Wt 151 lb (68.5 kg)   SpO2 99%   BMI 22.30 kg/m   General:   Alert,  Well-developed, well-nourished, pleasant and cooperative in NAD Lungs:  Clear throughout to auscultation.   Heart:  Regular rate and rhythm; no murmurs, clicks, rubs,  or gallops. Abdomen:  Soft, nontender and nondistended. Normal bowel sounds.   Neuro/Psych:  Alert and cooperative. Normal mood and affect. A and O x 3   @Rand Boller  , MD, [facility] [phone] (pager) 08/05/2021 2:01 PM@

## 2021-08-05 NOTE — Op Note (Addendum)
[facility] Patient Name: Julia Blackwell Procedure Date: 08/05/2021 2:10 PM MRN: [id number] Endoscopist: Iva Boop , MD Age: 51 Referring MD:  Date of Birth: 06/30/1970 Gender: Female Account #: [id number] Procedure:                Upper GI endoscopy Indications:              Dysphagia, Eosinophilic esophagitis, Follow-up of                            eosinophilic esophagitis, For therapy of                            eosinophilic esophagitis Medicines:                Monitored Anesthesia Care Procedure:                Pre-Anesthesia Assessment:                           - Prior to the procedure, a History and Physical                            was performed, and patient medications and                            allergies were reviewed. The patient's tolerance of                            previous anesthesia was also reviewed. The risks                            and benefits of the procedure and the sedation                            options and risks were discussed with the patient.                            All questions were answered, and informed consent                            was obtained. Prior Anticoagulants: The patient has                            taken no previous anticoagulant or antiplatelet                            agents. ASA Grade Assessment: II - A patient with                            mild systemic disease. After reviewing the risks                            and benefits, the patient was deemed in  satisfactory condition to undergo the procedure.                           After obtaining informed consent, the endoscope was                            passed under direct vision. Throughout the                            procedure, the patient's blood pressure, pulse, and                            oxygen saturations were monitored continuously. The                            GIF [id number] #[id number] was introduced  through the                            mouth, and advanced to the second part of duodenum.                            The upper GI endoscopy was accomplished without                            difficulty. The patient tolerated the procedure                            well. Scope In: Scope Out: Findings:                 Mucosal changes including feline appearance and                            longitudinal furrows were found in the entire                            esophagus. Biopsies were obtained from the proximal                            and distal esophagus with cold forceps for                            histology of suspected eosinophilic esophagitis.                            Verification of patient identification for the                            specimen was done. Estimated blood loss was                            minimal. The scope was withdrawn. Dilation was                            performed with a Maloney dilator with  mild                            resistance at 50 Fr. The dilation site was examined                            following endoscope reinsertion and showed moderate                            mucosal disruption. Estimated blood loss was                            minimal.                           Multiple diminutive sessile polyps with no stigmata                            of recent bleeding were found in the gastric fundus                            and in the gastric body. Biopsies were taken with a                            cold forceps for histology. Verification of patient                            identification for the specimen was done. Estimated                            blood loss was minimal.                           The exam was otherwise without abnormality.                           The cardia and gastric fundus were otherwise normal                            on retroflexion. Complications:            No immediate  complications. Estimated Blood Loss:     Estimated blood loss was minimal. Impression:               - Esophageal mucosal changes consistent with                            eosinophilic esophagitis. Biopsied. Dilated.                           - Multiple gastric polyps. Biopsied.                           - The examination was otherwise normal. Recommendation:           - Patient has a contact number available for  emergencies. The signs and symptoms of potential                            delayed complications were discussed with the                            patient. Return to normal activities tomorrow.                            Written discharge instructions were provided to the                            patient.                           - Clear liquids x 1 hour then soft foods rest of                            day. Start prior diet tomorrow.                           - Continue present medications.                           - Await pathology results.                           - See the other procedure note for documentation of                            additional recommendations. Gatha Mayer, MD 08/05/2021 2:54:27 PM This report has been signed electronically.

## 2021-08-05 NOTE — Op Note (Signed)
[facility] Patient Name: Julia Blackwell Procedure Date: 08/05/2021 2:09 PM MRN: [id number] Endoscopist: Iva Boop , MD Age: 51 Referring MD:  Date of Birth: 30-Aug-1970 Gender: Female Account #: [id number] Procedure:                Colonoscopy Indications:              Rectal bleeding Medicines:                Monitored Anesthesia Care Procedure:                Pre-Anesthesia Assessment:                           - Prior to the procedure, a History and Physical                            was performed, and patient medications and                            allergies were reviewed. The patient's tolerance of                            previous anesthesia was also reviewed. The risks                            and benefits of the procedure and the sedation                            options and risks were discussed with the patient.                            All questions were answered, and informed consent                            was obtained. Prior Anticoagulants: The patient has                            taken no previous anticoagulant or antiplatelet                            agents. ASA Grade Assessment: II - A patient with                            mild systemic disease. After reviewing the risks                            and benefits, the patient was deemed in                            satisfactory condition to undergo the procedure.                           After obtaining informed consent, the colonoscope  was passed under direct vision. Throughout the                            procedure, the patient's blood pressure, pulse, and                            oxygen saturations were monitored continuously. The                            Olympus [id number]-[id number] (#[id number]) Colonoscope was                            introduced through the anus and advanced to the the                            cecum, identified by appendiceal orifice and                             ileocecal valve. The colonoscopy was performed                            without difficulty. The patient tolerated the                            procedure well. The quality of the bowel                            preparation was good. The appendiceal orifice and                            the rectum were photographed. The bowel preparation                            used was Miralax via split dose instruction. Scope In: 2:25:44 PM Scope Out: 2:41:23 PM Scope Withdrawal Time: 0 hours 9 minutes 52 seconds  Total Procedure Duration: 0 hours 15 minutes 39 seconds  Findings:                 The perianal and digital rectal examinations were                            normal.                           External and internal hemorrhoids were found.                           The exam was otherwise without abnormality on                            direct and retroflexion views. Complications:            No immediate complications. Estimated Blood Loss:     Estimated blood loss: none. Impression:               - External and internal hemorrhoids.                           -  The examination was otherwise normal on direct                            and retroflexion views.                           - No specimens collected. Recommendation:           - Patient has a contact number available for                            emergencies. The signs and symptoms of potential                            delayed complications were discussed with the                            patient. Return to normal activities tomorrow.                            Written discharge instructions were provided to the                            patient.                           - Clear liquids x 1 hour then soft foods rest of                            day. Start prior diet tomorrow.                           - Continue present medications.                           - Repeat colonoscopy in 10 years  for screening                            purposes. Iva Boop, MD 08/05/2021 2:58:55 PM This report has been signed electronically.

## 2021-08-06 ENCOUNTER — Telehealth: Payer: Self-pay | Admitting: *Deleted

## 2021-08-06 NOTE — Telephone Encounter (Signed)
  Follow up Call-     08/05/2021    1:33 PM  Call back number  Post procedure Call Back phone  # [phone]  Permission to leave phone message Yes     Patient questions: Message left to call us if necessary.

## 2021-08-16 ENCOUNTER — Other Ambulatory Visit: Payer: Self-pay

## 2021-09-06 ENCOUNTER — Other Ambulatory Visit: Payer: Self-pay | Admitting: Physician Assistant

## 2021-11-03 DIAGNOSIS — D225 Melanocytic nevi of trunk: Secondary | ICD-10-CM | POA: Diagnosis not present

## 2021-11-20 DIAGNOSIS — Z23 Encounter for immunization: Secondary | ICD-10-CM | POA: Diagnosis not present

## 2021-11-20 DIAGNOSIS — Z13220 Encounter for screening for lipoid disorders: Secondary | ICD-10-CM | POA: Diagnosis not present

## 2021-11-20 DIAGNOSIS — I10 Essential (primary) hypertension: Secondary | ICD-10-CM | POA: Diagnosis not present

## 2021-11-20 DIAGNOSIS — Z6821 Body mass index (BMI) 21.0-21.9, adult: Secondary | ICD-10-CM | POA: Diagnosis not present

## 2021-11-20 DIAGNOSIS — Z131 Encounter for screening for diabetes mellitus: Secondary | ICD-10-CM | POA: Diagnosis not present

## 2021-11-20 DIAGNOSIS — Z713 Dietary counseling and surveillance: Secondary | ICD-10-CM | POA: Diagnosis not present

## 2022-02-24 DIAGNOSIS — Z6822 Body mass index (BMI) 22.0-22.9, adult: Secondary | ICD-10-CM | POA: Diagnosis not present

## 2022-02-24 DIAGNOSIS — Z01419 Encounter for gynecological examination (general) (routine) without abnormal findings: Secondary | ICD-10-CM | POA: Diagnosis not present

## 2022-02-24 DIAGNOSIS — Z1231 Encounter for screening mammogram for malignant neoplasm of breast: Secondary | ICD-10-CM | POA: Diagnosis not present

## 2022-02-24 LAB — HM MAMMOGRAPHY

## 2022-05-10 DIAGNOSIS — F418 Other specified anxiety disorders: Secondary | ICD-10-CM | POA: Insufficient documentation

## 2022-06-09 DIAGNOSIS — Z6823 Body mass index (BMI) 23.0-23.9, adult: Secondary | ICD-10-CM | POA: Diagnosis not present

## 2022-06-09 DIAGNOSIS — F419 Anxiety disorder, unspecified: Secondary | ICD-10-CM | POA: Diagnosis not present

## 2022-06-09 DIAGNOSIS — K219 Gastro-esophageal reflux disease without esophagitis: Secondary | ICD-10-CM | POA: Diagnosis not present

## 2022-06-09 DIAGNOSIS — I10 Essential (primary) hypertension: Secondary | ICD-10-CM | POA: Diagnosis not present

## 2022-06-25 ENCOUNTER — Emergency Department (HOSPITAL_COMMUNITY)
Admission: EM | Admit: 2022-06-25 | Discharge: 2022-06-25 | Disposition: A | Discharge status: Home / Self Care | Payer: BC Managed Care – PPO | Attending: Emergency Medicine | Admitting: Emergency Medicine

## 2022-06-25 ENCOUNTER — Emergency Department (HOSPITAL_COMMUNITY): Payer: BC Managed Care – PPO

## 2022-06-25 ENCOUNTER — Encounter (HOSPITAL_COMMUNITY): Payer: Self-pay

## 2022-06-25 ENCOUNTER — Other Ambulatory Visit: Payer: Self-pay

## 2022-06-25 DIAGNOSIS — R109 Unspecified abdominal pain: Secondary | ICD-10-CM | POA: Diagnosis not present

## 2022-06-25 DIAGNOSIS — N200 Calculus of kidney: Secondary | ICD-10-CM | POA: Insufficient documentation

## 2022-06-25 DIAGNOSIS — N202 Calculus of kidney with calculus of ureter: Secondary | ICD-10-CM | POA: Diagnosis not present

## 2022-06-25 DIAGNOSIS — I10 Essential (primary) hypertension: Secondary | ICD-10-CM | POA: Insufficient documentation

## 2022-06-25 LAB — COMPREHENSIVE METABOLIC PANEL
ALT: 15 U/L (ref 0–44)
AST: 25 U/L (ref 15–41)
Albumin: 4.5 g/dL (ref 3.5–5.0)
Alkaline Phosphatase: 39 U/L (ref 38–126)
Anion gap: 12 (ref 5–15)
BUN: 24 mg/dL — ABNORMAL HIGH (ref 6–20)
CO2: 21 mmol/L — ABNORMAL LOW (ref 22–32)
Calcium: 8.9 mg/dL (ref 8.9–10.3)
Chloride: 105 mmol/L (ref 98–111)
Creatinine, Ser: 0.77 mg/dL (ref 0.44–1.00)
GFR, Estimated: >60 mL/min (ref >60)
Glucose, Bld: 137 mg/dL — ABNORMAL HIGH (ref 70–99)
Potassium: 2.8 mmol/L — ABNORMAL LOW (ref 3.5–5.1)
Sodium: 138 mmol/L (ref 135–145)
Total Bilirubin: 0.7 mg/dL (ref 0.3–1.2)
Total Protein: 7.7 g/dL (ref 6.5–8.1)

## 2022-06-25 LAB — URINALYSIS, ROUTINE W REFLEX MICROSCOPIC
Bilirubin Urine: NEGATIVE
Glucose, UA: NEGATIVE mg/dL
Hgb urine dipstick: NEGATIVE
Ketones, ur: NEGATIVE mg/dL
Leukocytes,Ua: NEGATIVE
Nitrite: NEGATIVE
Protein, ur: NEGATIVE mg/dL
Specific Gravity, Urine: 1.016 (ref 1.005–1.030)
pH: 9 — ABNORMAL HIGH (ref 5.0–8.0)

## 2022-06-25 LAB — CBC
HCT: 39.3 % (ref 36.0–46.0)
Hemoglobin: 13.2 g/dL (ref 12.0–15.0)
MCH: 29.3 pg (ref 26.0–34.0)
MCHC: 33.6 g/dL (ref 30.0–36.0)
MCV: 87.1 fL (ref 80.0–100.0)
Platelets: 306 10*3/uL (ref 150–400)
RBC: 4.51 MIL/uL (ref 3.87–5.11)
RDW: 12.7 % (ref 11.5–15.5)
WBC: 6.2 10*3/uL (ref 4.0–10.5)
nRBC: 0 % (ref 0.0–0.2)

## 2022-06-25 MED ORDER — SODIUM CHLORIDE 0.9 % IV BOLUS
1000.0000 mL | Freq: Once | INTRAVENOUS | Status: AC
  Administered 2022-06-25: 1000 mL via INTRAVENOUS

## 2022-06-25 MED ORDER — ONDANSETRON HCL 4 MG/2ML IJ SOLN
4.0000 mg | Freq: Once | INTRAMUSCULAR | Status: AC
  Administered 2022-06-25: 4 mg via INTRAVENOUS
  Filled 2022-06-25: qty 2

## 2022-06-25 MED ORDER — HYDROMORPHONE HCL 1 MG/ML IJ SOLN
1.0000 mg | Freq: Once | INTRAMUSCULAR | Status: AC
  Administered 2022-06-25: 1 mg via INTRAVENOUS
  Filled 2022-06-25: qty 1

## 2022-06-25 MED ORDER — OXYCODONE HCL 5 MG PO TABS: 5.0000 mg | ORAL_TABLET | ORAL | 0 refills | Status: DC | PRN

## 2022-06-25 MED ORDER — NAPROXEN 500 MG PO TABS: 500.0000 mg | ORAL_TABLET | Freq: Two times a day (BID) | ORAL | 0 refills | Status: DC

## 2022-06-25 MED ORDER — POTASSIUM CHLORIDE 10 MEQ/100ML IV SOLN
10.0000 meq | INTRAVENOUS | Status: DC
  Administered 2022-06-25: 10 meq via INTRAVENOUS
  Filled 2022-06-25: qty 100

## 2022-06-25 MED ORDER — POTASSIUM CHLORIDE CRYS ER 20 MEQ PO TBCR
40.0000 meq | EXTENDED_RELEASE_TABLET | Freq: Once | ORAL | Status: AC
  Administered 2022-06-25: 40 meq via ORAL
  Filled 2022-06-25: qty 2

## 2022-06-25 MED ORDER — KETOROLAC TROMETHAMINE 15 MG/ML IJ SOLN
15.0000 mg | Freq: Once | INTRAMUSCULAR | Status: AC
Start: 2022-06-25 — End: 2022-06-25
  Administered 2022-06-25: 15 mg via INTRAVENOUS
  Filled 2022-06-25: qty 1

## 2022-06-25 MED ORDER — TAMSULOSIN HCL 0.4 MG PO CAPS: 0.4000 mg | ORAL_CAPSULE | Freq: Every day | ORAL | 0 refills | Status: DC

## 2022-06-25 MED ORDER — ONDANSETRON 4 MG PO TBDP: 4.0000 mg | ORAL_TABLET | Freq: Three times a day (TID) | ORAL | 0 refills | Status: DC | PRN

## 2022-06-25 MED ORDER — HYDROMORPHONE HCL 1 MG/ML IJ SOLN
0.5000 mg | Freq: Once | INTRAMUSCULAR | Status: AC
  Administered 2022-06-25: 0.5 mg via INTRAVENOUS
  Filled 2022-06-25: qty 1

## 2022-06-25 MED ORDER — POTASSIUM CHLORIDE ER 10 MEQ PO TBCR: 10.0000 meq | EXTENDED_RELEASE_TABLET | Freq: Every day | ORAL | 0 refills | Status: DC

## 2022-06-25 MED ORDER — KETOROLAC TROMETHAMINE 15 MG/ML IJ SOLN
15.0000 mg | Freq: Once | INTRAMUSCULAR | Status: AC
  Administered 2022-06-25: 15 mg via INTRAVENOUS
  Filled 2022-06-25: qty 1

## 2022-06-25 NOTE — ED Triage Notes (Signed)
Pt. Arrives POV c/o flank pain x1 hr. Pt. Also endorses nausea, vomiting, and abdominal pain.

## 2022-06-25 NOTE — Discharge Instructions (Addendum)
You were evaluated in the Emergency Department and after careful evaluation, we did not find any emergent condition requiring admission or further testing in the hospital.  Your exam/testing today was overall reassuring.  Symptoms seem to be due to a kidney stone.  Use the Naprosyn twice daily for pain.  Use the oxycodone for more significant pain.  Use the Zofran as needed for nausea.  Use the Flomax to help you pass the stone.  Follow-up with the urology doctors.  Please return to the Emergency Department if you experience any worsening of your condition.  Thank you for allowing [place] to be a part of your care..    follow up with your md in 1-2 weeks for your potassium

## 2022-06-25 NOTE — ED Provider Notes (Signed)
[facility]-EMERGENCY DEPT [facility] Emergency Department Provider Note MRN:  [id number]  Arrival date & time: 06/25/22     Chief Complaint   Flank Pain   History of Present Illness   Julia Blackwell is a 52 y.o. year-old female with no pertinent past medical history presenting to the ED with chief complaint of leg pain.  Acute right flank pain starting an hour prior to arrival.  Severe associated with nausea and vomiting.  Has never happened before.  Review of Systems  A thorough review of systems was obtained and all systems are negative except as noted in the HPI and PMH.   Patient's Health History    Past Medical History:  Diagnosis Date   Abnormal EKG    Allergy    Anemia    with pregnancy   Chest pressure    Elevated blood pressure reading    Eosinophilic esophagitis 06/22/2016   Fecal occult blood test positive 09/04/2019   GERD (gastroesophageal reflux disease)    Hypertension    Intermittent chest pain    Menorrhagia 09/04/2019   Pelvic mass 09/04/2019   Pharyngoesophageal dysphagia 05/23/2016   Psoriasis 09/04/2019   Thyroid nodule 05/23/2016    Past Surgical History:  Procedure Laterality Date   COLONOSCOPY     DILATION AND CURETTAGE OF UTERUS     ENDOMETRIAL ABLATION  2019   FOOT SURGERY Left    UPPER GASTROINTESTINAL ENDOSCOPY      Family History  Problem Relation Age of Onset   Breast cancer Mother 49   Diabetes Father    Heart disease Father    Breast cancer Maternal Aunt 78   Colon cancer Other        PGGF   Esophageal cancer Neg Hx    Pancreatic cancer Neg Hx    Stomach cancer Neg Hx    Rectal cancer Neg Hx     Social History   Socioeconomic History   Marital status: Married    Spouse name: Not on file   Number of children: Not on file   Years of education: Not on file   Highest education level: Not on file  Occupational History   Not on file  Tobacco Use   Smoking status: Never   Smokeless tobacco: Never  Vaping Use   Vaping  Use: Never used  Substance and Sexual Activity   Alcohol use: Yes    Comment: 1 drink daily   Drug use: No   Sexual activity: Yes    Birth control/protection: Other-see comments    Comment: husband has vesectomy  Other Topics Concern   Not on file  Social History Narrative   Not on file   Social Determinants of Health   Financial Resource Strain: Not on file  Food Insecurity: Not on file  Transportation Needs: Not on file  Physical Activity: Not on file  Stress: Not on file  Social Connections: Not on file  Intimate Partner Violence: Not on file     Physical Exam   Vitals:   06/25/22 0452  BP: (!) 144/85  Pulse: 78  Resp: 19  Temp: 97.6 F (36.4 C)  SpO2: 100%    CONSTITUTIONAL: Well-appearing, moderate distress due to pain NEURO/PSYCH:  Alert and oriented x 3, no focal deficits EYES:  eyes equal and reactive ENT/NECK:  no LAD, no JVD CARDIO: Regular rate, well-perfused, normal S1 and S2 PULM:  CTAB no wheezing or rhonchi GI/GU:  non-distended, non-tender MSK/SPINE:  No gross deformities, no edema SKIN:  no rash, atraumatic   *Additional and/or pertinent findings included in MDM below  Diagnostic and Interventional Summary    EKG Interpretation  Date/Time:    Ventricular Rate:    PR Interval:    QRS Duration:   QT Interval:    QTC Calculation:   R Axis:     Text Interpretation:         Labs Reviewed  COMPREHENSIVE METABOLIC PANEL - Abnormal; Notable for the following components:      Result Value   Potassium 2.8 (*)    CO2 21 (*)    Glucose, Bld 137 (*)    BUN 24 (*)    All other components within normal limits  URINALYSIS, ROUTINE W REFLEX MICROSCOPIC - Abnormal; Notable for the following components:   pH 9.0 (*)    All other components within normal limits  CBC    CT RENAL STONE STUDY  Final Result      Medications  potassium chloride 10 mEq in 100 mL IVPB (10 mEq Intravenous New Bag/Given 06/25/22 0658)  ondansetron (ZOFRAN)  injection 4 mg (has no administration in time range)  HYDROmorphone (DILAUDID) injection 0.5 mg (has no administration in time range)  ketorolac (TORADOL) 15 MG/ML injection 15 mg (has no administration in time range)  sodium chloride 0.9 % bolus 1,000 mL (0 mLs Intravenous Stopped 06/25/22 0552)  ondansetron (ZOFRAN) injection 4 mg (4 mg Intravenous Given 06/25/22 0513)  HYDROmorphone (DILAUDID) injection 1 mg (1 mg Intravenous Given 06/25/22 0514)  ketorolac (TORADOL) 15 MG/ML injection 15 mg (15 mg Intravenous Given 06/25/22 0513)     Procedures  /  Critical Care Procedures  ED Course and Medical Decision Making  Initial Impression and Ddx Presentation is highly suggestive of kidney stone.  Other considerations include diverticulitis, appendicitis, perforated viscus, SBO.  Past medical/surgical history that increases complexity of ED encounter: None  Interpretation of Diagnostics I personally reviewed the laboratory assessment and my interpretation is as follows: No significant blood count or electrolyte disturbance, urinalysis without signs of infection  CT reveals right kidney stone.  Also with free fluid possibly due to a calyceal leak or neuroma, will discuss with urology.  Patient Reassessment and Ultimate Disposition/Management     Per Dr. Pete Glatter of urology, possible calyceal leak his not terribly concerning, would treat the kidney stone as one normally would.  Patient having some return of pain and nausea, will redose medication, anticipating discharge if symptoms can be adequately controlled.  Signed out to oncoming provider at shift change.  Patient management required discussion with the following services or consulting groups:  None  Complexity of Problems Addressed Acute illness or injury that poses threat of life of bodily function  Additional Data Reviewed and Analyzed Further history obtained from: Further history from spouse/family member  Additional Factors  Impacting ED Encounter Risk Prescriptions, Use of parenteral controlled substances, and Consideration of hospitalization  Elmer Sow. Pilar Plate, MD [facility] Emergency Medicine [facility] [email or web address]  Final Clinical Impressions(s) / ED Diagnoses     ICD-10-CM   1. Kidney stone  N20.0       ED Discharge Orders          Ordered    naproxen (NAPROSYN) 500 MG tablet  2 times daily        06/25/22 0637    oxyCODONE (ROXICODONE) 5 MG immediate release tablet  Every 4 hours PRN        06/25/22 0637    tamsulosin (FLOMAX) 0.4  MG CAPS capsule  Daily        06/25/22 0637    ondansetron (ZOFRAN-ODT) 4 MG disintegrating tablet  Every 8 hours PRN        06/25/22 1610             Discharge Instructions Discussed with and Provided to Patient:     Discharge Instructions      You were evaluated in the Emergency Department and after careful evaluation, we did not find any emergent condition requiring admission or further testing in the hospital.  Your exam/testing today was overall reassuring.  Symptoms seem to be due to a kidney stone.  Use the Naprosyn twice daily for pain.  Use the oxycodone for more significant pain.  Use the Zofran as needed for nausea.  Use the Flomax to help you pass the stone.  Follow-up with the urology doctors.  Please return to the Emergency Department if you experience any worsening of your condition.  Thank you for allowing [place] to be a part of your care.        Sabas Sous, MD 06/25/22 0700

## 2022-06-28 DIAGNOSIS — N201 Calculus of ureter: Secondary | ICD-10-CM | POA: Diagnosis not present

## 2022-10-25 DIAGNOSIS — Z136 Encounter for screening for cardiovascular disorders: Secondary | ICD-10-CM | POA: Diagnosis not present

## 2022-10-25 DIAGNOSIS — Z6823 Body mass index (BMI) 23.0-23.9, adult: Secondary | ICD-10-CM | POA: Diagnosis not present

## 2022-10-25 DIAGNOSIS — Z13220 Encounter for screening for lipoid disorders: Secondary | ICD-10-CM | POA: Diagnosis not present

## 2022-10-25 DIAGNOSIS — I10 Essential (primary) hypertension: Secondary | ICD-10-CM | POA: Diagnosis not present

## 2022-10-25 DIAGNOSIS — Z131 Encounter for screening for diabetes mellitus: Secondary | ICD-10-CM | POA: Diagnosis not present

## 2022-10-25 DIAGNOSIS — Z713 Dietary counseling and surveillance: Secondary | ICD-10-CM | POA: Diagnosis not present

## 2022-12-02 DIAGNOSIS — J209 Acute bronchitis, unspecified: Secondary | ICD-10-CM | POA: Diagnosis not present

## 2022-12-15 DIAGNOSIS — Z6823 Body mass index (BMI) 23.0-23.9, adult: Secondary | ICD-10-CM | POA: Diagnosis not present

## 2022-12-15 DIAGNOSIS — I10 Essential (primary) hypertension: Secondary | ICD-10-CM | POA: Diagnosis not present

## 2022-12-15 DIAGNOSIS — F419 Anxiety disorder, unspecified: Secondary | ICD-10-CM | POA: Diagnosis not present

## 2022-12-15 DIAGNOSIS — K219 Gastro-esophageal reflux disease without esophagitis: Secondary | ICD-10-CM | POA: Diagnosis not present

## 2022-12-26 DIAGNOSIS — D2262 Melanocytic nevi of left upper limb, including shoulder: Secondary | ICD-10-CM | POA: Diagnosis not present

## 2022-12-26 DIAGNOSIS — D1809 Hemangioma of other sites: Secondary | ICD-10-CM | POA: Diagnosis not present

## 2022-12-26 DIAGNOSIS — L821 Other seborrheic keratosis: Secondary | ICD-10-CM | POA: Diagnosis not present

## 2022-12-26 DIAGNOSIS — D225 Melanocytic nevi of trunk: Secondary | ICD-10-CM | POA: Diagnosis not present

## 2022-12-26 DIAGNOSIS — L700 Acne vulgaris: Secondary | ICD-10-CM | POA: Diagnosis not present

## 2023-03-06 ENCOUNTER — Encounter (HOSPITAL_BASED_OUTPATIENT_CLINIC_OR_DEPARTMENT_OTHER): Payer: Self-pay | Admitting: *Deleted

## 2023-03-06 ENCOUNTER — Encounter (HOSPITAL_BASED_OUTPATIENT_CLINIC_OR_DEPARTMENT_OTHER): Payer: Self-pay | Admitting: Family Medicine

## 2023-03-06 ENCOUNTER — Ambulatory Visit (INDEPENDENT_AMBULATORY_CARE_PROVIDER_SITE_OTHER): Payer: BC Managed Care – PPO | Admitting: Family Medicine

## 2023-03-06 VITALS — BP 136/90 | HR 71 | Ht 69.0 in | Wt 165.6 lb

## 2023-03-06 DIAGNOSIS — Z7689 Persons encountering health services in other specified circumstances: Secondary | ICD-10-CM

## 2023-03-06 DIAGNOSIS — E7800 Pure hypercholesterolemia, unspecified: Secondary | ICD-10-CM

## 2023-03-06 DIAGNOSIS — I10 Essential (primary) hypertension: Secondary | ICD-10-CM | POA: Diagnosis not present

## 2023-03-06 DIAGNOSIS — E041 Nontoxic single thyroid nodule: Secondary | ICD-10-CM | POA: Diagnosis not present

## 2023-03-06 MED ORDER — HYDROCHLOROTHIAZIDE 25 MG PO TABS
12.5000 mg | ORAL_TABLET | Freq: Every day | ORAL | 3 refills | Status: DC
Start: 2023-03-06 — End: 2023-05-09

## 2023-03-06 MED ORDER — LOSARTAN POTASSIUM 25 MG PO TABS
25.0000 mg | ORAL_TABLET | Freq: Every day | ORAL | 3 refills | Status: DC
Start: 2023-03-06 — End: 2023-05-09

## 2023-03-06 NOTE — Progress Notes (Signed)
New Patient Office Visit  Subjective:   Bessie Boyte February 04, 1971 03/06/2023  Chief Complaint  Patient presents with   New Patient (Initial Visit)    Patient is here today to get established with the practice. Has concerns about her BP.    HPI: Elaina Cara presents today to establish care at [facility] at [facility]. Introduced to Publishing rights manager role and practice setting.  All questions answered.   Last PCP: [facility]  Concerns: See below   HYPERTENSION: Rheagan Nayak presents for the medical management of hypertension.  Patient's current hypertension medication regimen is: Lisinopril-hydrochlorothiazide 20-12.5 every day. She previously came off from Lisinopril and only on hydrochlorothiazide due to dry cough. She went back on lisinopril due to uncontrolled BP with hydrochlorothiazide alone.  Patient is  currently taking prescribed medications for HTN.  Patient is  regularly keeping a check on BP at home.  Adhering to low sodium diet: Yes Exercising Regularly: Yes Denies headache, dizziness, CP, SHOB, vision changes.   BP Readings from Last 3 Encounters:  03/06/23 (!) 136/90  06/25/22 (!) 142/94  08/05/21 120/70    ELEVATED CHOLESTEROL: Tynisha Ogan presents for the medical management of elevated cholesterol. Unsure if labs were drawn while fasting. Patient states she has an appt with life insurance this afternoon and is having blood work drawn.  Patient's current HLD regimen is: Diet, Exercise  Adhering to heathy diet: yes Exercising regularly: yes Denies myalgias.   10/25/22 POCT Lipids Results:   Total Cholesterol: 212 Trig: 45 HDL: 71 LDL: 132   THYROID NODULES:  Patient had finding of multinodular goiter with US Thyroid per performed for palpable abnormality on 4/23/ 2018 per chart review. Radiologist recommended follow-up of right inferior gland with annual ultrasound surveillance until 5-year  stability has been confirmed.  Patient is unsure when her last thyroid ultrasound was.  We will obtain her records from [facility] and order ultrasound as directed with record review.  The following portions of the patient's history were reviewed and updated as appropriate: past medical history, past surgical history, family history, social history, allergies, medications, and problem list.   Patient Active Problem List   Diagnosis Date Noted   Elevated cholesterol 03/06/2023   Primary hypertension 03/06/2023   Mixed anxiety and depressive disorder 05/10/2022   Murmur 09/06/2019   Menorrhagia 09/04/2019   Pelvic mass 09/04/2019   Psoriasis 09/04/2019   Eosinophilic esophagitis 06/22/2016   Pharyngoesophageal dysphagia 05/23/2016   Thyroid nodule 05/23/2016   Past Medical History:  Diagnosis Date   Abnormal EKG    Abnormal EKG    Allergy    Anemia    with pregnancy   Chest pressure    Chest pressure    Elevated blood pressure reading    Eosinophilic esophagitis 06/22/2016   Fecal occult blood test positive 09/04/2019   GERD (gastroesophageal reflux disease)    Hypertension    Intermittent chest pain    Intermittent chest pain    Menorrhagia 09/04/2019   Pelvic mass 09/04/2019   Pharyngoesophageal dysphagia 05/23/2016   Psoriasis 09/04/2019   Thyroid nodule 05/23/2016   Past Surgical History:  Procedure Laterality Date   COLONOSCOPY     DILATION AND CURETTAGE OF UTERUS     ENDOMETRIAL ABLATION  2019   FOOT SURGERY Left    UPPER GASTROINTESTINAL ENDOSCOPY     Family History  Problem Relation Age of Onset   Breast cancer Mother 37   Diabetes Father  Heart disease Father    Breast cancer Maternal Aunt 63   Colon cancer Other        PGGF   Esophageal cancer Neg Hx    Pancreatic cancer Neg Hx    Stomach cancer Neg Hx    Rectal cancer Neg Hx    Social History   Socioeconomic History   Marital status: Married    Spouse name: Not on file   Number of  children: Not on file   Years of education: Not on file   Highest education level: Not on file  Occupational History   Not on file  Tobacco Use   Smoking status: Never   Smokeless tobacco: Never  Vaping Use   Vaping status: Never Used  Substance and Sexual Activity   Alcohol use: Yes    Comment: 1 drink daily   Drug use: No   Sexual activity: Yes    Birth control/protection: Other-see comments    Comment: husband has vesectomy  Other Topics Concern   Not on file  Social History Narrative   Not on file   Social Drivers of Health   Financial Resource Strain: Low Risk  (03/06/2023)   Overall Financial Resource Strain (CARDIA)    Difficulty of Paying Living Expenses: Not hard at all  Food Insecurity: No Food Insecurity (03/06/2023)   Hunger Vital Sign    Worried About Running Out of Food in the Last Year: Never true    Ran Out of Food in the Last Year: Never true  Transportation Needs: No Transportation Needs (03/06/2023)   PRAPARE - Administrator, Civil Service (Medical): No    Lack of Transportation (Non-Medical): No  Physical Activity: Sufficiently Active (03/06/2023)   Exercise Vital Sign    Days of Exercise per Week: 7 days    Minutes of Exercise per Session: 30 min  Stress: No Stress Concern Present (03/06/2023)   [employer] of Occupational Health - Occupational Stress Questionnaire    Feeling of Stress : Only a little  Social Connections: Moderately Isolated (03/06/2023)   Social Connection and Isolation Panel [NHANES]    Frequency of Communication with Friends and Family: More than three times a week    Frequency of Social Gatherings with Friends and Family: More than three times a week    Attends Religious Services: Never    Database administrator or Organizations: No    Attends Banker Meetings: Never    Marital Status: Married  Catering manager Violence: Not At Risk (03/06/2023)   Humiliation, Afraid, Rape, and Kick questionnaire     Fear of Current or Ex-Partner: No    Emotionally Abused: No    Physically Abused: No    Sexually Abused: No   Outpatient Medications Prior to Visit  Medication Sig Dispense Refill   loratadine (CLARITIN) 10 MG tablet Take 10 mg by mouth daily.     Multiple Vitamins-Minerals (MULTIVITAMIN WOMEN 50+) TABS Take by mouth.     NON FORMULARY Amberien-OTC for menopause     pantoprazole (PROTONIX) 40 MG tablet TAKE 1 TABLET DAILY (APPOINTMENT 06/30/21) 90 tablet 3   sertraline (ZOLOFT) 50 MG tablet Take 50 mg by mouth daily.     lisinopril-hydrochlorothiazide (ZESTORETIC) 20-12.5 MG tablet Take 1 tablet by mouth daily.     clobetasol ointment (TEMOVATE) 0.05 % APPLY 1 A SMALL AMOUNT TO AFFECTED AREA TWICE A DAY (Patient not taking: Reported on 03/06/2023)     docusate sodium (COLACE) 100 MG capsule  Take 100 mg by mouth daily as needed for mild constipation. (Patient not taking: Reported on 03/06/2023)     naproxen (NAPROSYN) 500 MG tablet Take 1 tablet (500 mg total) by mouth 2 (two) times daily. 30 tablet 0   naproxen sodium (ALEVE) 220 MG tablet Take by mouth.     ondansetron (ZOFRAN-ODT) 4 MG disintegrating tablet Take 1 tablet (4 mg total) by mouth every 8 (eight) hours as needed for nausea or vomiting. 20 tablet 0   oxyCODONE (ROXICODONE) 5 MG immediate release tablet Take 1 tablet (5 mg total) by mouth every 4 (four) hours as needed for severe pain. 8 tablet 0   potassium chloride (KLOR-CON) 10 MEQ tablet Take 1 tablet (10 mEq total) by mouth daily. 7 tablet 0   tamsulosin (FLOMAX) 0.4 MG CAPS capsule Take 1 capsule (0.4 mg total) by mouth daily. 7 capsule 0   zinc gluconate 50 MG tablet Take 50 mg by mouth daily.     No facility-administered medications prior to visit.   No Known Allergies  ROS: A complete ROS was performed with pertinent positives/negatives noted in the HPI. The remainder of the ROS are negative.   Objective:   Today's Vitals   03/06/23 1007 03/06/23 1025  BP: (!)  132/97 (!) 136/90  Pulse: 71   Weight: 165 lb 9.6 oz (75.1 kg)   Height: 5\' 9"  (1.753 m)     GENERAL: Well-appearing, in NAD. Well nourished.  SKIN: Pink, warm and dry. No rash, lesion, ulceration, or ecchymoses.  Head: Normocephalic. NECK: Trachea midline. Full ROM w/o pain or tenderness. No lymphadenopathy.  EARS: Tympanic membranes are intact, translucent without bulging and without drainage. Appropriate landmarks visualized.  EYES: Conjunctiva clear without exudates. EOMI, PERRL, no drainage present.  NOSE: Septum midline w/o deformity. Nares patent, mucosa pink and non-inflamed w/o drainage. No sinus tenderness.  THROAT: Uvula midline. Oropharynx clear. Tonsils non-inflamed without exudate. Mucous membranes pink and moist.  RESPIRATORY: Chest wall symmetrical. Respirations even and non-labored. Breath sounds clear to auscultation bilaterally.  CARDIAC: S1, S2 present, regular rate and rhythm without murmur or gallops. Peripheral pulses 2+ bilaterally.  MSK: Muscle tone and strength appropriate for age. Joints w/o tenderness, redness, or swelling.  EXTREMITIES: Without clubbing, cyanosis, or edema.  NEUROLOGIC: No motor or sensory deficits. Steady, even gait. C2-C12 intact.  PSYCH/MENTAL STATUS: Alert, oriented x 3. Cooperative, appropriate mood and affect.    Health Maintenance Due  Topic Date Due   DTaP/Tdap/Td (1 - Tdap) Never done   Cervical Cancer Screening (HPV/Pap Cotest)  07/13/2019   MAMMOGRAM  10/09/2022   Zoster Vaccines- Shingrix (1 of 2) Never done    No results found for any visits on 03/06/23.     Assessment & Plan:  1. Encounter to establish care with new doctor (Primary) Discussed role of PCP with patient and will obtain records from [facility] for review.  Patient states she is due to have her mammogram and Pap smear updated tomorrow with OB/GYN.  We will obtain records from them as well.  Recommended Shingrix vaccine per age-related recommendation  with her local pharmacy.  2. Primary hypertension Uncontrolled, will discontinue lisinopril due to adverse effect of dry cough.  Will start losartan 25 mg once daily and continue hydrochlorothiazide 12.5 mg once daily.  Patient will continue to monitor blood pressure regularly and return in approximately 6 weeks.  Patient will obtain lab work today with life insurance exam and will bring her lab work with her  to her next appointment for review. - hydrochlorothiazide (HYDRODIURIL) 25 MG tablet; Take 0.5 tablets (12.5 mg total) by mouth daily.  Dispense: 90 tablet; Refill: 3 - losartan (COZAAR) 25 MG tablet; Take 1 tablet (25 mg total) by mouth daily.  Dispense: 90 tablet; Refill: 3  3. Thyroid nodule Will obtain [facility] records to see when patient's last thyroid ultrasound was and will order ultrasound as needed after records review.  Patient is currently asymptomatic for thyroid disease.  4. Elevated cholesterol Recommend check of fasting cholesterol per previous elevated levels.  Patient will obtain lab work with her life insurance company today and will bring results with her to next appointment.   Patient to reach out to office if new, worrisome, or unresolved symptoms arise or if no improvement in patient's condition. Patient verbalized understanding and is agreeable to treatment plan. All questions answered to patient's satisfaction.    Return in about 6 weeks (around 04/17/2023) for HYPERTENSION, hyperlipidemia check up .    Hilbert Bible, [place]

## 2023-03-06 NOTE — Patient Instructions (Addendum)
Shingrix Vaccine at your local pharmacy.    Bring your lab results to your next visit.    Stop Lisinopril-hydrochlorothiazide when you receive your new medication.  Monitor your blood pressure regularly and keep a log.

## 2023-03-09 ENCOUNTER — Other Ambulatory Visit: Payer: Self-pay | Admitting: Obstetrics and Gynecology

## 2023-03-09 ENCOUNTER — Encounter (HOSPITAL_BASED_OUTPATIENT_CLINIC_OR_DEPARTMENT_OTHER): Payer: Self-pay | Admitting: Family Medicine

## 2023-03-09 DIAGNOSIS — R928 Other abnormal and inconclusive findings on diagnostic imaging of breast: Secondary | ICD-10-CM

## 2023-03-15 ENCOUNTER — Other Ambulatory Visit (HOSPITAL_BASED_OUTPATIENT_CLINIC_OR_DEPARTMENT_OTHER): Payer: Self-pay | Admitting: Family Medicine

## 2023-03-15 DIAGNOSIS — Z8639 Personal history of other endocrine, nutritional and metabolic disease: Secondary | ICD-10-CM

## 2023-03-16 ENCOUNTER — Ambulatory Visit
Admission: RE | Admit: 2023-03-16 | Discharge: 2023-03-16 | Disposition: A | Discharge status: Home / Self Care | Payer: BC Managed Care – PPO | Source: Ambulatory Visit | Attending: Family Medicine | Admitting: Family Medicine

## 2023-03-16 DIAGNOSIS — Z8639 Personal history of other endocrine, nutritional and metabolic disease: Secondary | ICD-10-CM

## 2023-03-20 ENCOUNTER — Other Ambulatory Visit (HOSPITAL_BASED_OUTPATIENT_CLINIC_OR_DEPARTMENT_OTHER): Payer: Self-pay | Admitting: Family Medicine

## 2023-03-20 DIAGNOSIS — E041 Nontoxic single thyroid nodule: Secondary | ICD-10-CM

## 2023-03-20 NOTE — Progress Notes (Signed)
 Discussed thyroid  ultrasound results with patient and patient is agreeable to proceed with fine needle aspiration of nodule #2 per radiology recommendations.  Will place referral to endocrinology for evaluation and fine-needle aspiration.

## 2023-03-23 ENCOUNTER — Ambulatory Visit
Admission: RE | Admit: 2023-03-23 | Discharge: 2023-03-23 | Disposition: A | Discharge status: Home / Self Care | Payer: BC Managed Care – PPO | Source: Ambulatory Visit | Attending: Obstetrics and Gynecology | Admitting: Obstetrics and Gynecology

## 2023-03-23 DIAGNOSIS — R928 Other abnormal and inconclusive findings on diagnostic imaging of breast: Secondary | ICD-10-CM

## 2023-04-18 ENCOUNTER — Ambulatory Visit (HOSPITAL_BASED_OUTPATIENT_CLINIC_OR_DEPARTMENT_OTHER): Payer: BC Managed Care – PPO | Admitting: Family Medicine

## 2023-05-09 ENCOUNTER — Ambulatory Visit (HOSPITAL_BASED_OUTPATIENT_CLINIC_OR_DEPARTMENT_OTHER): Payer: BC Managed Care – PPO | Admitting: Family Medicine

## 2023-05-09 ENCOUNTER — Encounter (HOSPITAL_BASED_OUTPATIENT_CLINIC_OR_DEPARTMENT_OTHER): Payer: Self-pay | Admitting: Family Medicine

## 2023-05-09 VITALS — BP 150/98 | HR 74 | Ht 69.0 in | Wt 164.0 lb

## 2023-05-09 DIAGNOSIS — E7800 Pure hypercholesterolemia, unspecified: Secondary | ICD-10-CM | POA: Diagnosis not present

## 2023-05-09 DIAGNOSIS — E041 Nontoxic single thyroid nodule: Secondary | ICD-10-CM | POA: Diagnosis not present

## 2023-05-09 DIAGNOSIS — I10 Essential (primary) hypertension: Secondary | ICD-10-CM | POA: Diagnosis not present

## 2023-05-09 MED ORDER — PANTOPRAZOLE SODIUM 40 MG PO TBEC
40.0000 mg | DELAYED_RELEASE_TABLET | Freq: Every day | ORAL | 3 refills | Status: AC
  Filled 2024-01-08: qty 90, 90d supply, fill #0
  Filled 2024-01-22: qty 30, 30d supply, fill #0
  Filled 2024-02-27: qty 30, 30d supply, fill #1

## 2023-05-09 MED ORDER — HYDROCHLOROTHIAZIDE 25 MG PO TABS
25.0000 mg | ORAL_TABLET | Freq: Every day | ORAL | 3 refills | Status: AC
  Filled 2024-01-08: qty 90, 90d supply, fill #0
  Filled 2024-01-22: qty 30, 30d supply, fill #0
  Filled 2024-02-27: qty 30, 30d supply, fill #1

## 2023-05-09 MED ORDER — SERTRALINE HCL 50 MG PO TABS
50.0000 mg | ORAL_TABLET | Freq: Every day | ORAL | 3 refills | Status: AC
  Filled 2024-01-08: qty 90, 90d supply, fill #0
  Filled 2024-01-22: qty 30, 30d supply, fill #0
  Filled 2024-01-22: qty 90, 90d supply, fill #0
  Filled 2024-02-27: qty 30, 30d supply, fill #1

## 2023-05-09 MED ORDER — LOSARTAN POTASSIUM 50 MG PO TABS: 50.0000 mg | ORAL_TABLET | Freq: Every day | ORAL | 3 refills | Status: DC

## 2023-05-09 NOTE — Progress Notes (Signed)
 Subjective:   Julia Blackwell 01/25/1971 05/09/2023  Chief Complaint  Patient presents with   Medical Management of Chronic Issues    32-month follow up; pt is here following up for her BP. States that her BP has still been running high when she has been checking it at home.    HPI: Julia Blackwell presents today for re-assessment and management of chronic medical conditions.  HYPERTENSION: Julia Blackwell presents for the medical management of hypertension.  Patient's current hypertension medication regimen is: Losartan 25mg , hydrochlorothiazide 25mg   Patient is  currently taking prescribed medications for HTN.  Patient is  regularly keeping a check on BP at home.   Average Readings: 141/91, 136/93, 126/85, 155/100, 125/88 145/93, 150/93  Adhering to low sodium diet: Yes Exercising Regularly: No Denies headache, dizziness, CP, SHOB, vision changes.    Patient reports she has been experiencing some cramping in her hands and her calf muscles. She has begun using liquid IV packets daily in her water. She has noticed some mild intermittent swelling in her hands that resolves spontaneously.   BP Readings from Last 3 Encounters:  05/09/23 (!) 150/98  03/06/23 (!) 136/90  06/25/22 (!) 142/94   Wt Readings from Last 3 Encounters:  05/09/23 164 lb (74.4 kg)  03/06/23 165 lb 9.6 oz (75.1 kg)  06/25/22 150 lb (68 kg)     HYPERLIPIDEMIA: Julia Blackwell presents for the medical management of hyperlipidemia. Patient had recent lipid panel drawn in January 2025 by her life insurance company.  Patient's current HLD regimen is: Diet, Exercise Patient is not currently taking prescribed medications for HLD.  Adhering to heathy diet: Yes Exercising regularly: Yes  Lab Report 03/08/2023 Total Cholesterol 213 HDL 76.8 LDL 124 Trig 60 Chol/HDL Ratio 2.7 LDL/HDL ration 1.61  A1C 5.3  Calculated ASCVD Risk is 1.6%    THYROID NODULES:  Patient has a hx of thyroid nodules with  most recent US on 05/30/2016. Patient had a repeat US per guidelines for yearly imaging of thyroid nodules on 03/16/2023. Patient was notified of results below and recommended to follow up with endocrinology due to slight enlargement of one nodule from comparison. Patient states she has not been contacted by Endocrinology yet. Referral was placed in February.   US Thyroid 03/16/2023 Narrative & Impression  CLINICAL DATA:  Prior ultrasound follow-up.   EXAM: THYROID ULTRASOUND   TECHNIQUE: Ultrasound examination of the thyroid gland and adjacent soft tissues was performed.   COMPARISON:  05/30/2016   FINDINGS: Parenchymal Echotexture: Moderately heterogenous   Isthmus: Normal in size measuring 0.4 cm diameter, unchanged   Right lobe: Enlarged measuring 7.3 x 2.2 x 2.0 cm, previously, 6.9 x 2.5 x 1.9 cm   Left lobe: Borderline enlarged measuring 5.5 x 2.4 x 2.0 cm previously, 5.4 x 1.9 x 1 6 cm   _________________________________________________________   Estimated total number of nodules >/= 1 cm: 5   Number of spongiform nodules >/=  2 cm not described below (TR1): 0   Number of mixed cystic and solid nodules >/= 1.5 cm not described below (TR2): 0   _________________________________________________________   The 1.4 x 1.0 x 0.9 cm isoechoic spongiform/benign-appearing nodule mid posterior aspect right lobe of the thyroid (labeled 1), is minimally increased in size compared the 2018 examination, previously, 1.0 x 1.0 x 0.7 cm with slight size differences attributable to interval partial cystic degeneration, a typically benign finding.   _________________________________________________________   Nodule # 2:   Prior biopsy: No  Location: Right; Inferior   Maximum size: 2.5 cm; Other 2 dimensions: 2.1 x 1.9 cm, previously, 2.2 x 2.2 x 1.7 cm   Composition: solid/almost completely solid (2)   Echogenicity: isoechoic (1)   Shape: not taller-than-wide (0)    Margins: ill-defined (0)   Echogenic foci: none (0)   ACR TI-RADS total points: 3.   ACR TI-RADS risk category:  TR3 (3 points).   Significant change in size (>/= 20% in two dimensions and minimal increase of 2 mm): Yes   Change in features: No   Change in ACR TI-RADS risk category: No   ACR TI-RADS recommendations:   **Given size (>/= 2.5 cm) and appearance, fine needle aspiration of this mildly suspicious nodule should be considered based on TI-RADS criteria.   _________________________________________________________   The 2.0 x 1.8 x 1.3 cm isoechoic spongiform/benign-appearing nodule within mid aspect of the left lobe thyroid (labeled 3), is unchanged compared to the 05/2016 examination, previously, 1 3 x 1.2 x 0.8 cm.   _________________________________________________________   Nodule # 4:   Location: Left; Inferior - not definitively seen on the 2018 examination   Maximum size: 1.3 cm; Other 2 dimensions: 1.3 x 1.1 cm   Composition: solid/almost completely solid (2)   Echogenicity: isoechoic (1)   Shape: not taller-than-wide (0)   Margins: ill-defined (0)   Echogenic foci: none (0)   ACR TI-RADS total points: 3.   ACR TI-RADS risk category: TR3 (3 points).   ACR TI-RADS recommendations:   Given size (<1.4 cm) and appearance, this nodule does NOT meet TI-RADS criteria for biopsy or dedicated follow-up.   ________________________________________________________   The 1.0 x 0.8 x 0.7 cm isoechoic ill-defined nodule/pseudonodule within the inferior pole of the left lobe of the thyroid (labeled 5) is unchanged compared to the 2018 examination, previously, 1.3 x 1 x 0.6 cm. Imaging stability for greater 5 years is indicative a benign etiology.   IMPRESSION: 1. Similar findings of thyromegaly and multinodular goiter. 2. Labeled #2 has slightly increased in size compared to the 2018 examination and as such now meets imaging criteria to  recommend percutaneous sampling as indicated. 3. None of the additional discretely measured thyroid nodules meet imaging criteria to recommend percutaneous sampling or continued dedicated follow-up.  The above is in keeping with the ACR TI-RADS recommendations - J Am Coll Radiol 2017;14:587-595.     Electronically Signed   By: Simonne Come M.D.   On: 03/19/2023 09:53    The following portions of the patient's history were reviewed and updated as appropriate: past medical history, past surgical history, family history, social history, allergies, medications, and problem list.   Patient Active Problem List   Diagnosis Date Noted   Elevated cholesterol 03/06/2023   Primary hypertension 03/06/2023   Mixed anxiety and depressive disorder 05/10/2022   Murmur 09/06/2019   Menorrhagia 09/04/2019   Pelvic mass 09/04/2019   Psoriasis 09/04/2019   Eosinophilic esophagitis 06/22/2016   Pharyngoesophageal dysphagia 05/23/2016   Thyroid nodule 05/23/2016   Past Medical History:  Diagnosis Date   Abnormal EKG    Abnormal EKG    Allergy    Anemia    with pregnancy   Chest pressure    Chest pressure    Elevated blood pressure reading    Eosinophilic esophagitis 06/22/2016   Fecal occult blood test positive 09/04/2019   GERD (gastroesophageal reflux disease)    Hypertension    Intermittent chest pain    Intermittent chest pain    Menorrhagia  09/04/2019   Pelvic mass 09/04/2019   Pharyngoesophageal dysphagia 05/23/2016   Psoriasis 09/04/2019   Thyroid nodule 05/23/2016   Past Surgical History:  Procedure Laterality Date   COLONOSCOPY     DILATION AND CURETTAGE OF UTERUS     ENDOMETRIAL ABLATION  2019   FOOT SURGERY Left    UPPER GASTROINTESTINAL ENDOSCOPY     Family History  Problem Relation Age of Onset   Breast cancer Mother 41   Diabetes Father    Heart disease Father    Breast cancer Maternal Aunt 63   Colon cancer Other        PGGF   Esophageal cancer Neg Hx     Pancreatic cancer Neg Hx    Stomach cancer Neg Hx    Rectal cancer Neg Hx    Outpatient Medications Prior to Visit  Medication Sig Dispense Refill   Docusate Calcium (STOOL SOFTENER PO) Take by mouth.     loratadine (CLARITIN) 10 MG tablet Take 10 mg by mouth daily.     Multiple Vitamins-Minerals (MULTIVITAMIN WOMEN 50+) TABS Take by mouth.     Multiple Vitamins-Minerals (ZINC PO) Take by mouth.     NON FORMULARY Amberien-OTC for menopause     hydrochlorothiazide (HYDRODIURIL) 25 MG tablet Take 0.5 tablets (12.5 mg total) by mouth daily. 90 tablet 3   losartan (COZAAR) 25 MG tablet Take 1 tablet (25 mg total) by mouth daily. 90 tablet 3   pantoprazole (PROTONIX) 40 MG tablet TAKE 1 TABLET DAILY (APPOINTMENT 06/30/21) 90 tablet 3   sertraline (ZOLOFT) 50 MG tablet Take 50 mg by mouth daily.     No facility-administered medications prior to visit.   No Known Allergies   ROS: A complete ROS was performed with pertinent positives/negatives noted in the HPI. The remainder of the ROS are negative.    Objective:   Today's Vitals   05/09/23 1540 05/09/23 1550  BP: (!) 146/95 (!) 150/98  Pulse: 74   SpO2: 100%   Weight: 164 lb (74.4 kg)   Height: 5\' 9"  (1.753 m)     Physical Exam          GENERAL: Well-appearing, in NAD. Well nourished.  SKIN: Pink, warm and dry. Head: Normocephalic. NECK: Trachea midline. Full ROM w/o pain or tenderness.  RESPIRATORY: Chest wall symmetrical. Respirations even and non-labored. Breath sounds clear to auscultation bilaterally.  CARDIAC: S1, S2 present, regular rate and rhythm without murmur or gallops. Peripheral pulses 2+ bilaterally.  MSK: Muscle tone and strength appropriate for age.  NEUROLOGIC: No motor or sensory deficits. Steady, even gait. C2-C12 intact.  PSYCH/MENTAL STATUS: Alert, oriented x 3. Cooperative, appropriate mood and affect.   The 10-year ASCVD risk score (Arnett DK, et al., 2019) is: 1.8%     Assessment & Plan:  1. Primary  hypertension (Primary) Uncontrolled. Will increase losartan to 50mg  daily and continue hydrochlorothiazide. Will check BMP and magnesium for electrolytes given concern for muscle cramping. Repeat BP check in 6 weeks. Goal discussed with patient for BP less than 130/80.  - losartan (COZAAR) 50 MG tablet; Take 1 tablet (50 mg total) by mouth daily.  Dispense: 60 tablet; Refill: 3 - hydrochlorothiazide (HYDRODIURIL) 25 MG tablet; Take 1 tablet (25 mg total) by mouth daily.  Dispense: 90 tablet; Refill: 3 - Basic Metabolic Panel (BMET) - Magnesium  2. Elevated cholesterol Stable. Will continue diet and exercise measures to reduce ASCVD risk. Not requiring statin therapy at this time.   3. Thyroid nodule Patient  needing Endo follow up due to enlargement of nodule. Referral is still active. Will provide phone number of Endocrinology to establish.    Meds ordered this encounter  Medications   pantoprazole (PROTONIX) 40 MG tablet    Sig: Take 1 tablet (40 mg total) by mouth daily.    Dispense:  90 tablet    Refill:  3    Supervising Provider:   DE Peru, RAYMOND J [[id number]]   losartan (COZAAR) 50 MG tablet    Sig: Take 1 tablet (50 mg total) by mouth daily.    Dispense:  60 tablet    Refill:  3    Supervising Provider:   DE Peru, RAYMOND J [[id number]]   hydrochlorothiazide (HYDRODIURIL) 25 MG tablet    Sig: Take 1 tablet (25 mg total) by mouth daily.    Dispense:  90 tablet    Refill:  3    Supervising Provider:   DE [place], RAYMOND J [[id number]]   sertraline (ZOLOFT) 50 MG tablet    Sig: Take 1 tablet (50 mg total) by mouth daily.    Dispense:  90 tablet    Refill:  3    Supervising Provider:   DE [place], RAYMOND J [[id number]]   Lab Orders         Basic Metabolic Panel (BMET)         Magnesium      Return for 6 weeks BP Check Only; 5 months AE, HTN (labs at visit) .    Patient to reach out to office if new, worrisome, or unresolved symptoms arise or if no improvement in patient's  condition. Patient verbalized understanding and is agreeable to treatment plan. All questions answered to patient's satisfaction.    Hilbert Bible, [place]

## 2023-05-09 NOTE — Patient Instructions (Addendum)
 Cut back on liquid IV  Monitor BP 2-3x a week and keep a log    Please Call Endocrinology to schedule consult for thyroid nodule  Endocrinology:  Address: [address] [place], [place], [place] [address] Phone: [phone]

## 2023-05-10 LAB — BASIC METABOLIC PANEL WITH GFR
BUN/Creatinine Ratio: 23 (ref 9–23)
BUN: 18 mg/dL (ref 6–24)
CO2: 24 mmol/L (ref 20–29)
Calcium: 9.9 mg/dL (ref 8.7–10.2)
Chloride: 100 mmol/L (ref 96–106)
Creatinine, Ser: 0.77 mg/dL (ref 0.57–1.00)
Glucose: 79 mg/dL (ref 70–99)
Potassium: 3.3 mmol/L — ABNORMAL LOW (ref 3.5–5.2)
Sodium: 139 mmol/L (ref 134–144)
eGFR: 94 mL/min/{1.73_m2} (ref >59)

## 2023-05-10 LAB — MAGNESIUM: Magnesium: 2.1 mg/dL (ref 1.6–2.3)

## 2023-05-11 ENCOUNTER — Other Ambulatory Visit (HOSPITAL_BASED_OUTPATIENT_CLINIC_OR_DEPARTMENT_OTHER): Payer: Self-pay | Admitting: Family Medicine

## 2023-05-11 DIAGNOSIS — I10 Essential (primary) hypertension: Secondary | ICD-10-CM

## 2023-05-11 DIAGNOSIS — E876 Hypokalemia: Secondary | ICD-10-CM

## 2023-05-12 ENCOUNTER — Encounter (HOSPITAL_BASED_OUTPATIENT_CLINIC_OR_DEPARTMENT_OTHER): Payer: Self-pay | Admitting: *Deleted

## 2023-06-21 ENCOUNTER — Ambulatory Visit (HOSPITAL_BASED_OUTPATIENT_CLINIC_OR_DEPARTMENT_OTHER): Admitting: *Deleted

## 2023-06-21 VITALS — BP 127/86

## 2023-06-21 DIAGNOSIS — I10 Essential (primary) hypertension: Secondary | ICD-10-CM | POA: Diagnosis not present

## 2023-06-21 NOTE — Progress Notes (Signed)
 Patient came in today for a BP check. Pt's BP was 127/86.

## 2023-06-22 ENCOUNTER — Encounter (HOSPITAL_BASED_OUTPATIENT_CLINIC_OR_DEPARTMENT_OTHER): Payer: Self-pay | Admitting: Family Medicine

## 2023-06-29 ENCOUNTER — Other Ambulatory Visit (HOSPITAL_COMMUNITY): Payer: Self-pay | Admitting: Family Medicine

## 2023-06-29 ENCOUNTER — Other Ambulatory Visit (HOSPITAL_BASED_OUTPATIENT_CLINIC_OR_DEPARTMENT_OTHER): Payer: Self-pay | Admitting: Family Medicine

## 2023-06-29 LAB — BASIC METABOLIC PANEL WITH GFR
BUN/Creatinine Ratio: 33 — ABNORMAL HIGH (ref 9–23)
BUN: 23 mg/dL (ref 6–24)
CO2: 24 mmol/L (ref 20–29)
Calcium: 9.5 mg/dL (ref 8.7–10.2)
Chloride: 104 mmol/L (ref 96–106)
Creatinine, Ser: 0.7 mg/dL (ref 0.57–1.00)
Glucose: 104 mg/dL — ABNORMAL HIGH (ref 70–99)
Potassium: 3.6 mmol/L (ref 3.5–5.2)
Sodium: 143 mmol/L (ref 134–144)
eGFR: 105 mL/min/{1.73_m2} (ref >59)

## 2023-06-30 ENCOUNTER — Ambulatory Visit (HOSPITAL_BASED_OUTPATIENT_CLINIC_OR_DEPARTMENT_OTHER): Payer: Self-pay | Admitting: Family Medicine

## 2023-06-30 NOTE — Progress Notes (Signed)
 Julia Blackwell,  Thank you for getting the lab drawn. Your electrolytes are at baseline. Please continue to monitor your blood pressure and stay well hydrated. We will follow up at your next appt as scheduled.

## 2023-10-11 ENCOUNTER — Encounter (HOSPITAL_BASED_OUTPATIENT_CLINIC_OR_DEPARTMENT_OTHER): Admitting: Family Medicine

## 2023-10-17 ENCOUNTER — Encounter: Payer: Self-pay | Admitting: Endocrinology

## 2023-10-17 ENCOUNTER — Ambulatory Visit: Admitting: Endocrinology

## 2023-10-17 VITALS — BP 130/94 | HR 74 | Resp 20 | Ht 69.0 in | Wt 167.2 lb

## 2023-10-17 DIAGNOSIS — E042 Nontoxic multinodular goiter: Secondary | ICD-10-CM | POA: Diagnosis not present

## 2023-10-17 LAB — T4, FREE: Free T4: 1.3 ng/dL (ref 0.8–1.8)

## 2023-10-17 LAB — TSH: TSH: 1.4 m[IU]/L

## 2023-10-17 NOTE — Progress Notes (Unsigned)
 Outpatient Endocrinology Note [place] Mariette Cowley, MD   Patient's Name: Julia Blackwell    DOB: 05-16-1970    MRN: [id number]  REASON OF VISIT: New consult for thyroid  nodules   PCP: Knute Thersia Bitters, FNP  REFERRING PROVIDER: Knute Thersia Bitters, FNP  HISTORY OF PRESENT ILLNESS:   Julia Blackwell is a 53 y.o. old female with past medical history as listed below is presented for new consult for thyroid  nodules.  Pertinent Thyroid  History:  Patient is referred to endocrinology for evaluation and management of thyroid  nodules.  Initial consult on October 17, 2023.  Patient was diagnosed with thyroid  nodules in 2018 with initial ultrasound on May 30, 2016, initially felt on physical examination.  She had follow-up ultrasound in February 2025. Reviewed images.  Bilateral multiple thyroid  nodules relatively stable compared to 2018.  Largest thyroid  nodule is right inferior nodule measuring 2.5 x 2.1 x 1.9 cm mildly increased in size compared to 2018, almost completely solid.  There are bilateral thyroid  nodules, 4 more measuring 1 to 2 cm in maximal dimension.  Patient denies any neck compressive symptoms.  No hypo and hyperthyroid symptoms.  She has never been on thyroid  medication.  No family history of thyroid  disorder.  No radiation exposure to head and neck.  - Thyroid  US  on 03/16/2023 : reviewed images showed   CLINICAL DATA:  Prior ultrasound follow-up.   EXAM: THYROID  ULTRASOUND   TECHNIQUE: Ultrasound examination of the thyroid  gland and adjacent soft tissues was performed.   COMPARISON:  05/30/2016   FINDINGS: Parenchymal Echotexture: Moderately heterogenous   Isthmus: Normal in size measuring 0.4 cm diameter, unchanged   Right lobe: Enlarged measuring 7.3 x 2.2 x 2.0 cm, previously, 6.9 x 2.5 x 1.9 cm   Left lobe: Borderline enlarged measuring 5.5 x 2.4 x 2.0 cm previously, 5.4 x 1.9 x 1 6 cm   _________________________________________________________    Estimated total number of nodules >/= 1 cm: 5   Number of spongiform nodules >/=  2 cm not described below (TR1): 0   Number of mixed cystic and solid nodules >/= 1.5 cm not described below (TR2): 0   _________________________________________________________   The 1.4 x 1.0 x 0.9 cm isoechoic spongiform/benign-appearing nodule mid posterior aspect right lobe of the thyroid  (labeled 1), is minimally increased in size compared the 2018 examination, previously, 1.0 x 1.0 x 0.7 cm with slight size differences attributable to interval partial cystic degeneration, a typically benign finding.   _________________________________________________________   Nodule # 2:   Prior biopsy: No   Location: Right; Inferior   Maximum size: 2.5 cm; Other 2 dimensions: 2.1 x 1.9 cm, previously, 2.2 x 2.2 x 1.7 cm   Composition: solid/almost completely solid (2)   Echogenicity: isoechoic (1)   Shape: not taller-than-wide (0)   Margins: ill-defined (0)   Echogenic foci: none (0)   ACR TI-RADS total points: 3.   ACR TI-RADS risk category:  TR3 (3 points).   Significant change in size (>/= 20% in two dimensions and minimal increase of 2 mm): Yes   Change in features: No   Change in ACR TI-RADS risk category: No   ACR TI-RADS recommendations:   **Given size (>/= 2.5 cm) and appearance, fine needle aspiration of this mildly suspicious nodule should be considered based on TI-RADS criteria.   _________________________________________________________   The 2.0 x 1.8 x 1.3 cm isoechoic spongiform/benign-appearing nodule within mid aspect of the left lobe thyroid  (labeled 3), is unchanged compared to the 05/2016 examination, previously,  1 3 x 1.2 x 0.8 cm.   _________________________________________________________   Nodule # 4:   Location: Left; Inferior - not definitively seen on the 2018 examination   Maximum size: 1.3 cm; Other 2 dimensions: 1.3 x 1.1 cm   Composition:  solid/almost completely solid (2)   Echogenicity: isoechoic (1)   Shape: not taller-than-wide (0)   Margins: ill-defined (0)   Echogenic foci: none (0)   ACR TI-RADS total points: 3.   ACR TI-RADS risk category: TR3 (3 points).   ACR TI-RADS recommendations:   Given size (<1.4 cm) and appearance, this nodule does NOT meet TI-RADS criteria for biopsy or dedicated follow-up.   ________________________________________________________   The 1.0 x 0.8 x 0.7 cm isoechoic ill-defined nodule/pseudonodule within the inferior pole of the left lobe of the thyroid  (labeled 5) is unchanged compared to the 2018 examination, previously, 1.3 x 1 x 0.6 cm. Imaging stability for greater 5 years is indicative a benign etiology.   IMPRESSION: 1. Similar findings of thyromegaly and multinodular goiter. 2. Labeled #2 has slightly increased in size compared to the 2018 examination and as such now meets imaging criteria to recommend percutaneous sampling as indicated. 3. None of the additional discretely measured thyroid  nodules meet imaging criteria to recommend percutaneous sampling or continued dedicated follow-up.   REVIEW OF SYSTEMS:  As per history of present illness.   PAST MEDICAL HISTORY: Past Medical History:  Diagnosis Date   Abnormal EKG    Abnormal EKG    Allergy    Anemia    with pregnancy   Chest pressure    Chest pressure    Elevated blood pressure reading    Eosinophilic esophagitis 06/22/2016   Fecal occult blood test positive 09/04/2019   GERD (gastroesophageal reflux disease)    Hypertension    Intermittent chest pain    Intermittent chest pain    Menorrhagia 09/04/2019   Pelvic mass 09/04/2019   Pharyngoesophageal dysphagia 05/23/2016   Psoriasis 09/04/2019   Thyroid  nodule 05/23/2016    PAST SURGICAL HISTORY: Past Surgical History:  Procedure Laterality Date   COLONOSCOPY     DILATION AND CURETTAGE OF UTERUS     ENDOMETRIAL ABLATION  2019   FOOT  SURGERY Left    UPPER GASTROINTESTINAL ENDOSCOPY      ALLERGIES: No Known Allergies  FAMILY HISTORY:  Family History  Problem Relation Age of Onset   Breast cancer Mother 42   Diabetes Father    Heart disease Father    Breast cancer Maternal Aunt 30   Colon cancer Other        PGGF   Esophageal cancer Neg Hx    Pancreatic cancer Neg Hx    Stomach cancer Neg Hx    Rectal cancer Neg Hx     SOCIAL HISTORY: Social History   Socioeconomic History   Marital status: Married    Spouse name: Not on file   Number of children: Not on file   Years of education: Not on file   Highest education level: Not on file  Occupational History   Not on file  Tobacco Use   Smoking status: Never   Smokeless tobacco: Never  Vaping Use   Vaping status: Never Used  Substance and Sexual Activity   Alcohol use: Yes    Comment: 1 drink daily   Drug use: No   Sexual activity: Yes    Birth control/protection: Other-see comments    Comment: husband has vesectomy  Other Topics Concern   Not  on file  Social History Narrative   Not on file   Social Drivers of Health   Financial Resource Strain: Low Risk  (03/06/2023)   Overall Financial Resource Strain (CARDIA)    Difficulty of Paying Living Expenses: Not hard at all  Food Insecurity: No Food Insecurity (03/06/2023)   Hunger Vital Sign    Worried About Running Out of Food in the Last Year: Never true    Ran Out of Food in the Last Year: Never true  Transportation Needs: No Transportation Needs (03/06/2023)   PRAPARE - Administrator, Civil Service (Medical): No    Lack of Transportation (Non-Medical): No  Physical Activity: Sufficiently Active (03/06/2023)   Exercise Vital Sign    Days of Exercise per Week: 7 days    Minutes of Exercise per Session: 30 min  Stress: No Stress Concern Present (03/06/2023)   [employer] of Occupational Health - Occupational Stress Questionnaire    Feeling of Stress : Only a little  Social  Connections: Moderately Isolated (03/06/2023)   Social Connection and Isolation Panel    Frequency of Communication with Friends and Family: More than three times a week    Frequency of Social Gatherings with Friends and Family: More than three times a week    Attends Religious Services: Never    Database administrator or Organizations: No    Attends Banker Meetings: Never    Marital Status: Married    MEDICATIONS:  Current Outpatient Medications  Medication Sig Dispense Refill   Docusate Calcium (STOOL SOFTENER PO) Take by mouth.     hydrochlorothiazide  (HYDRODIURIL ) 25 MG tablet Take 1 tablet (25 mg total) by mouth daily. 90 tablet 3   loratadine (CLARITIN) 10 MG tablet Take 10 mg by mouth daily.     losartan  (COZAAR ) 50 MG tablet Take 1 tablet (50 mg total) by mouth daily. 60 tablet 3   Multiple Vitamins-Minerals (MULTIVITAMIN WOMEN 50+) TABS Take by mouth.     Multiple Vitamins-Minerals (ZINC PO) Take by mouth.     NON FORMULARY Amberien-OTC for menopause     pantoprazole  (PROTONIX ) 40 MG tablet Take 1 tablet (40 mg total) by mouth daily. 90 tablet 3   sertraline  (ZOLOFT ) 50 MG tablet Take 1 tablet (50 mg total) by mouth daily. 90 tablet 3   No current facility-administered medications for this visit.    PHYSICAL EXAM: Vitals:   10/17/23 1444 10/17/23 1445  BP: (!) 130/92 (!) 130/94  Pulse: 74   Resp: 20   SpO2: 99%   Weight: 167 lb 3.2 oz (75.8 kg)   Height: 5' 9 (1.753 m)    Body mass index is 24.69 kg/m.  Wt Readings from Last 3 Encounters:  10/18/23 167 lb (75.8 kg)  10/17/23 167 lb 3.2 oz (75.8 kg)  05/09/23 164 lb (74.4 kg)     General: Well developed, well nourished female in no apparent distress.  HEENT: AT/[place], no external lesions. Hearing intact to the spoken word Eyes: EOMI. No stare, proptosis. Conjunctiva clear and no icterus. Neck: Trachea midline, neck supple without appreciable thyromegaly or lymphadenopathy and + palpable right thyroid   nodule, small nontender and mobile. Lungs: Clear to auscultation, no wheeze. Respirations not labored Heart: S1S2, Regular in rate and rhythm. Abdomen: Soft, non tender Neurologic: Alert, oriented, normal speech, deep tendon biceps reflexes normal,  no gross focal neurological deficit Extremities: No pedal pitting edema, no tremors of outstretched hands Skin: Warm, color good.  Psychiatric: Does  not appear depressed or anxious  PERTINENT HISTORIC LABORATORY AND IMAGING STUDIES:  All pertinent laboratory results were reviewed. Please see HPI also for further details.   TSH  Date Value Ref Range Status  10/17/2023 1.40 mIU/L Final    Comment:              Reference Range .           > or = 20 Years  0.40-4.50 .                Pregnancy Ranges           First trimester    0.26-2.66           Second trimester   0.55-2.73           Third trimester    0.43-2.91      ASSESSMENT / PLAN  1. Multiple thyroid  nodules    - Patient has bilateral multiple thyroid  nodules, diagnosed in 2018 and follow-up ultrasound was completed in February 2025 mostly in the range of 1 to 2 cm in maximal dimension with no worrisome features.  Largest right thyroid  nodule measuring 2.5 cm in the right inferior lobe, likely increased compared to 2018. - No compressive symptoms, euthyroid, no family h/o thyroid  cancer, no h/o radiation exposure.  She is not on thyroid  medication.  Plan: - Right inferior thyroid  nodule measuring 2.5 cm meets the criteria for needle biopsy based on ATA guideline.  Will refer to IR.  Rest of the thyroid  nodules can be monitored with serial ultrasound. - Provided cytology benign will plan to repeat ultrasound thyroid  around 6 months from now. - Thyroid  function test today.   Thyroid  nodule / FNA talk: -Approximately 5% of individuals have palpable thyroid  nodules and 30% or more of adults have non-palpable nodules. The prevalence of nodules increases with age, so that perhaps  50% of individuals older than 51 years of age have nodules. Many of these nodules will be well under 1cm in size.  -The incidence of thyroid  cancer is low, but rising. The prevalence of thyroid  cancer is low compared with the prevalence of thyroid  nodule.  -There have been a number of studies that have estimated risk of malignancy in thyroid  nodule. The estimated risk varies with size and other characteristics of nodules selected for biopsy, the technique used for the biopsy, the institution and its referral patterns, and the characteristics of the local population. Most studies have estimated malignancy risk in nodules that are palpable or > 1cm on U/S to be in the range of 3-15%.    - In general, options for further evaluation include: - Follow with serial U/S - Fine needle aspiration biopsy - Thyroidectomy - In general the criteria for FNA biopsy includes:  - All palpable nodules - Generally nodules > 1 and for some nodules up to > 1 to 2 cm, based on other criteria.  - Rarely Nodules > 8-35mm in two or more dimensions with high risk sonographic features, or occuring in patients with high risk historical features. Nodules not meeting the above criteria can generally be followed with serial ultrasounds.  -Thyroid  FNA is required for further evaluation of nodule to see if suspicious for cancer which may require further surgical treatment. -There are false positive and false negative results with FNA and need for repeat FNA or surgical resection in some cases and also possibility of missing a diagnosis of Cancer in 5-15% of cases. -discussed the risk and benefits of procedure  and potential complications such as bleeding, pain in neck, swelling in neck in rare cases due to hematoma formation and causing respiratory compromise, possibility of infection etc. -discussed the need for continued follow up even if the nodule were found to be benign on FNA.  -discussed that the only near 100% method of ruling  out thyroid  cancer is by surgical resection. -patient agreeable for Thyroid  FNA.   You may read about thyroid  nodule from this link of American Thyroid  Association.  https://www.thyroid .org/thyroid -nodules/  We have reviewed our plan outlined above with the patient verbalizes understanding.    All questions were answered.  I provided ATA handout about thyroid  nodules and fine-needle aspiration.   Diagnoses and all orders for this visit:  Multiple thyroid  nodules -     US  FNA BX THYROID  1ST LESION AFIRMA; Future -     T4, free -     TSH    DISPOSITION Follow up in clinic in 6 - 7 months suggested.  All questions answered and patient verbalized understanding of the plan.  [place] Kacee Sukhu, MD [facility] Endocrinology [facility] [address] [place], [address] [place], [place] [address] Phone # [phone]  At least part of this note was generated using voice recognition software. Inadvertent word errors may have occurred, which were not recognized during the proofreading process.

## 2023-10-18 ENCOUNTER — Ambulatory Visit (HOSPITAL_BASED_OUTPATIENT_CLINIC_OR_DEPARTMENT_OTHER): Admitting: Family Medicine

## 2023-10-18 ENCOUNTER — Ambulatory Visit: Payer: Self-pay | Admitting: Endocrinology

## 2023-10-18 ENCOUNTER — Encounter (HOSPITAL_BASED_OUTPATIENT_CLINIC_OR_DEPARTMENT_OTHER): Payer: Self-pay | Admitting: Family Medicine

## 2023-10-18 VITALS — BP 130/84 | HR 71 | Ht 69.0 in | Wt 167.0 lb

## 2023-10-18 DIAGNOSIS — Z23 Encounter for immunization: Secondary | ICD-10-CM | POA: Diagnosis not present

## 2023-10-18 DIAGNOSIS — I10 Essential (primary) hypertension: Secondary | ICD-10-CM | POA: Diagnosis not present

## 2023-10-18 DIAGNOSIS — Z0000 Encounter for general adult medical examination without abnormal findings: Secondary | ICD-10-CM

## 2023-10-18 DIAGNOSIS — Z8639 Personal history of other endocrine, nutritional and metabolic disease: Secondary | ICD-10-CM | POA: Diagnosis not present

## 2023-10-18 DIAGNOSIS — Z13220 Encounter for screening for lipoid disorders: Secondary | ICD-10-CM

## 2023-10-18 NOTE — Patient Instructions (Addendum)
 Shingrix- Obtain at  pharmacy (2 shots separate by 3-6 months)  Flu Vaccine - obtain at pharmacy or in the office   Please return for fasting blood work.  For fasting, if your blood work is in the morning please do not eat any food after midnight.  You may have water or black coffee prior to your lab work.  Please take all regularly prescribed medications even if you are fasting.  If your blood work is in the afternoon, please fast for at least 5 to 6 hours.  You may continue to drink water and/or black coffee prior to your lab work.  Please take all scheduled medications even if you are fasting.   Continue to keep an eye on BP. Goal systolic blood pressure is 130 or less. Goal diastolic blood pressure is 90 or less.

## 2023-10-18 NOTE — Progress Notes (Signed)
 Subjective:   Julia Blackwell 1970/04/13  10/18/2023   CC: Chief Complaint  Patient presents with   Annual Exam    Patient is here today for her physical. Denies any main concerns for today's visit.    HPI: Julia Blackwell is a 53 y.o. female who presents for a routine health maintenance exam.  Labs collected at time of visit.    HEALTH SCREENINGS: - Vision Screening: up to date - Dental Visits: up to date - Pap smear: up to date - Breast Exam: Declined - STD Screening: Declined - Mammogram (40+): Up to date ; completed 03/23/2023 per chart review.  - Colonoscopy (45+): Up to date  - Bone Density (65+ or under 65 with predisposing conditions): Not applicable  - Lung CA screening with low-dose CT:  Not applicable Adults age 62-80 who are current cigarette smokers or quit within the last 15 years. Must have 20 pack year history.   Depression and Anxiety Screen done today and results listed below:     10/18/2023    3:16 PM 05/09/2023    3:44 PM 03/06/2023   10:17 AM  Depression screen PHQ 2/9  Decreased Interest 0 0 0  Down, Depressed, Hopeless 0 0 0  PHQ - 2 Score 0 0 0  Altered sleeping 0 0 0  Tired, decreased energy 0 0 0  Change in appetite 0 0 0  Feeling bad or failure about yourself  0 0 0  Trouble concentrating 0 0 0  Moving slowly or fidgety/restless 0 0 0  Suicidal thoughts 0 0 0  PHQ-9 Score 0 0 0  Difficult doing work/chores Not difficult at all Not difficult at all Not difficult at all      10/18/2023    3:16 PM 05/09/2023    3:44 PM 03/06/2023   10:18 AM  GAD 7 : Generalized Anxiety Score  Nervous, Anxious, on Edge 0 0 0  Control/stop worrying 0 0 0  Worry too much - different things 0 0 0  Trouble relaxing 0 0 0  Restless 0 0 0  Easily annoyed or irritable 0 0 0  Afraid - awful might happen 0 0 0  Total GAD 7 Score 0 0 0  Anxiety Difficulty Not difficult at all Not difficult at all Not difficult at all    IMMUNIZATIONS: - Tdap: Tetanus vaccination  status reviewed: Td vaccination indicated and given today. - HPV: Not applicable - Influenza: Postponed to flu season - Pneumovax: Not applicable - Prevnar 20: Not applicable - Shingrix (50+): Recommended and will obtain in pharmacy    Past medical history, surgical history, medications, allergies, family history and social history reviewed with patient today and changes made to appropriate areas of the chart.   Past Medical History:  Diagnosis Date   Abnormal EKG    Abnormal EKG    Allergy    Anemia    with pregnancy   Chest pressure    Chest pressure    Elevated blood pressure reading    Eosinophilic esophagitis 06/22/2016   Fecal occult blood test positive 09/04/2019   GERD (gastroesophageal reflux disease)    Hypertension    Intermittent chest pain    Intermittent chest pain    Menorrhagia 09/04/2019   Pelvic mass 09/04/2019   Pharyngoesophageal dysphagia 05/23/2016   Psoriasis 09/04/2019   Thyroid  nodule 05/23/2016    Past Surgical History:  Procedure Laterality Date   COLONOSCOPY     DILATION AND CURETTAGE OF UTERUS  ENDOMETRIAL ABLATION  2019   FOOT SURGERY Left    UPPER GASTROINTESTINAL ENDOSCOPY      Current Outpatient Medications on File Prior to Visit  Medication Sig   Docusate Calcium (STOOL SOFTENER PO) Take by mouth.   hydrochlorothiazide  (HYDRODIURIL ) 25 MG tablet Take 1 tablet (25 mg total) by mouth daily.   loratadine (CLARITIN) 10 MG tablet Take 10 mg by mouth daily.   losartan  (COZAAR ) 50 MG tablet Take 1 tablet (50 mg total) by mouth daily.   Multiple Vitamins-Minerals (MULTIVITAMIN WOMEN 50+) TABS Take by mouth.   Multiple Vitamins-Minerals (ZINC PO) Take by mouth.   NON FORMULARY Amberien-OTC for menopause   pantoprazole  (PROTONIX ) 40 MG tablet Take 1 tablet (40 mg total) by mouth daily.   sertraline  (ZOLOFT ) 50 MG tablet Take 1 tablet (50 mg total) by mouth daily.   No current facility-administered medications on file prior to visit.     No Known Allergies   Social History   Socioeconomic History   Marital status: Married    Spouse name: Not on file   Number of children: Not on file   Years of education: Not on file   Highest education level: Not on file  Occupational History   Not on file  Tobacco Use   Smoking status: Never   Smokeless tobacco: Never  Vaping Use   Vaping status: Never Used  Substance and Sexual Activity   Alcohol use: Yes    Comment: 1 drink daily   Drug use: No   Sexual activity: Yes    Birth control/protection: Other-see comments    Comment: husband has vesectomy  Other Topics Concern   Not on file  Social History Narrative   Not on file   Social Drivers of Health   Financial Resource Strain: Low Risk  (03/06/2023)   Overall Financial Resource Strain (CARDIA)    Difficulty of Paying Living Expenses: Not hard at all  Food Insecurity: No Food Insecurity (03/06/2023)   Hunger Vital Sign    Worried About Running Out of Food in the Last Year: Never true    Ran Out of Food in the Last Year: Never true  Transportation Needs: No Transportation Needs (03/06/2023)   PRAPARE - Administrator, Civil Service (Medical): No    Lack of Transportation (Non-Medical): No  Physical Activity: Sufficiently Active (03/06/2023)   Exercise Vital Sign    Days of Exercise per Week: 7 days    Minutes of Exercise per Session: 30 min  Stress: No Stress Concern Present (03/06/2023)   [employer] of Occupational Health - Occupational Stress Questionnaire    Feeling of Stress : Only a little  Social Connections: Moderately Isolated (03/06/2023)   Social Connection and Isolation Panel    Frequency of Communication with Friends and Family: More than three times a week    Frequency of Social Gatherings with Friends and Family: More than three times a week    Attends Religious Services: Never    Database administrator or Organizations: No    Attends Banker Meetings: Never     Marital Status: Married  Catering manager Violence: Not At Risk (03/06/2023)   Humiliation, Afraid, Rape, and Kick questionnaire    Fear of Current or Ex-Partner: No    Emotionally Abused: No    Physically Abused: No    Sexually Abused: No   Social History   Tobacco Use  Smoking Status Never  Smokeless Tobacco Never   Social History  Substance and Sexual Activity  Alcohol Use Yes   Comment: 1 drink daily    Family History  Problem Relation Age of Onset   Breast cancer Mother 74   Diabetes Father    Heart disease Father    Breast cancer Maternal Aunt 21   Colon cancer Other        PGGF   Esophageal cancer Neg Hx    Pancreatic cancer Neg Hx    Stomach cancer Neg Hx    Rectal cancer Neg Hx      ROS: Denies fever, fatigue, unexplained weight loss/gain, chest pain, SHOB, and palpitations. Denies neurological deficits, gastrointestinal or genitourinary complaints, and skin changes.   Objective:   Today's Vitals   10/18/23 1514 10/18/23 1539  BP: (!) 135/95 130/84  Pulse: 71   SpO2: 100%   Weight: 167 lb (75.8 kg)   Height: 5' 9 (1.753 m)     GENERAL APPEARANCE: Well-appearing, in NAD. Well nourished.  SKIN: Pink, warm and dry. Turgor normal. No rash, lesion, ulceration, or ecchymoses. Hair evenly distributed.  HEENT: HEAD: Normocephalic.  EYES: PERRLA. EOMI. Lids intact w/o defect. Sclera white, Conjunctiva pink w/o exudate.  EARS: External ear w/o redness, swelling, masses or lesions. EAC clear. TM's intact, translucent w/o bulging, appropriate landmarks visualized. Appropriate acuity to conversational tones.  NOSE: Septum midline w/o deformity. Nares patent, mucosa pink and non-inflamed w/o drainage. No sinus tenderness.  THROAT: Uvula midline. Oropharynx clear. Tonsils non-inflamed w/o exudate. Oral mucosa pink and moist.  NECK: Supple, Trachea midline. Full ROM w/o pain or tenderness. No lymphadenopathy. Thyroid  non-tender w/o enlargement or palpable masses.   RESPIRATORY: Chest wall symmetrical w/o masses. Respirations even and non-labored. Breath sounds clear to auscultation bilaterally. No wheezes, rales, rhonchi, or crackles. CARDIAC: S1, S2 present, regular rate and rhythm. No gallops, murmurs, rubs, or clicks. PMI w/o lifts, heaves, or thrills. No carotid bruits. Capillary refill <2 seconds. Peripheral pulses 2+ bilaterally. GI: Abdomen soft w/o distention. Normoactive bowel sounds. No palpable masses or tenderness. No guarding or rebound tenderness. Liver and spleen w/o tenderness or enlargement. No CVA tenderness.  MSK: Muscle tone and strength appropriate for age, w/o atrophy or abnormal movement.  EXTREMITIES: Active ROM intact, w/o tenderness, crepitus, or contracture. No obvious joint deformities or effusions. No clubbing, edema, or cyanosis.  NEUROLOGIC: CN's II-XII intact. Motor strength symmetrical with no obvious weakness. No sensory deficits. DTR's 2+ symmetric bilaterally. Steady, even gait.  PSYCH/MENTAL STATUS: Alert, oriented x 3. Cooperative, appropriate mood and affect.     Assessment & Plan:  1. Annual physical exam (Primary) Discussed preventative screenings, vaccines, and healthy lifestyle with patient. Will obtain Shingrix and Flu at local pharmacy when desired. She will return for fasting labs as part of AE.   - CBC with Differential/Platelet; Future - Comprehensive metabolic panel with GFR; Future  2. Primary hypertension Controlled. Continue current regimen. Will obtain CMP with labs as part of AE.  - Comprehensive metabolic panel with GFR; Future  3. H/O thyroid  nodule Patient met with Endocrinology on 10/17/2023. Visit reviewed and she is scheduled for upcoming biopsy of thyroid  nodule.   4. Screening for lipid disorders - Lipid panel; Future  5. Immunization due VIS provided.  - Tdap vaccine greater than or equal to 7yo IM   Orders Placed This Encounter  Procedures   Tdap vaccine greater than or equal to  7yo IM   CBC with Differential/Platelet    Standing Status:   Future    Expiration Date:  04/16/2024   Comprehensive metabolic panel with GFR    Standing Status:   Future    Expiration Date:   04/16/2024   Lipid panel    Standing Status:   Future    Expiration Date:   04/16/2024    PATIENT COUNSELING:  - Encouraged a healthy well-balanced diet. Patient may adjust caloric intake to maintain or achieve ideal body weight. May reduce intake of dietary saturated fat and total fat and have adequate dietary potassium and calcium preferably from fresh fruits, vegetables, and low-fat dairy products.   - Advised to avoid cigarette smoking. - Discussed with the patient that most people either abstain from alcohol or drink within safe limits (<=14/week and <=4 drinks/occasion for males, <=7/weeks and <= 3 drinks/occasion for females) and that the risk for alcohol disorders and other health effects rises proportionally with the number of drinks per week and how often a drinker exceeds daily limits. - Discussed cessation/primary prevention of drug use and availability of treatment for abuse.  - Discussed sexually transmitted diseases, avoidance of unintended pregnancy and contraceptive alternatives.  - Stressed the importance of regular exercise - Injury prevention: Discussed safety belts, safety helmets, smoke detector, smoking near bedding or upholstery.  - Dental health: Discussed importance of regular tooth brushing, flossing, and dental visits.   NEXT PREVENTATIVE PHYSICAL DUE IN 1 YEAR.  Return in about 1 year (around 10/17/2024) for ANNUAL PHYSICAL.  Patient to reach out to office if new, worrisome, or unresolved symptoms arise or if no improvement in patient's condition. Patient verbalized understanding and is agreeable to treatment plan. All questions answered to patient's satisfaction.    Thersia Schuyler Stark, [place]

## 2023-11-03 ENCOUNTER — Ambulatory Visit
Admission: RE | Admit: 2023-11-03 | Discharge: 2023-11-03 | Disposition: A | Discharge status: Home / Self Care | Source: Ambulatory Visit | Attending: Endocrinology | Admitting: Endocrinology

## 2023-11-03 ENCOUNTER — Other Ambulatory Visit (HOSPITAL_COMMUNITY)
Admission: RE | Admit: 2023-11-03 | Discharge: 2023-11-03 | Disposition: A | Discharge status: Home / Self Care | Source: Ambulatory Visit | Attending: Student | Admitting: Student

## 2023-11-03 DIAGNOSIS — E042 Nontoxic multinodular goiter: Secondary | ICD-10-CM

## 2023-11-07 ENCOUNTER — Ambulatory Visit: Payer: Self-pay | Admitting: Endocrinology

## 2023-11-07 LAB — CYTOLOGY - NON PAP

## 2023-12-18 ENCOUNTER — Telehealth (HOSPITAL_BASED_OUTPATIENT_CLINIC_OR_DEPARTMENT_OTHER): Payer: Self-pay | Admitting: Family Medicine

## 2023-12-18 ENCOUNTER — Telehealth (HOSPITAL_BASED_OUTPATIENT_CLINIC_OR_DEPARTMENT_OTHER): Payer: Self-pay | Admitting: *Deleted

## 2023-12-18 NOTE — Telephone Encounter (Signed)
 Records have not been faxed, documented in error. HIM needs faxed request. No number provided for Vernell to contact. No medical records have been sent.

## 2023-12-18 NOTE — Telephone Encounter (Signed)
 Copied from CRM #[id number]. Topic: Medical Record Request - Records Request >> Dec 18, 2023 11:41 AM Deaijah H wrote: Reason for CRM: [facility] needs medical records from 10/18/23 by today. fax: [phone]   Records faxed

## 2023-12-18 NOTE — Telephone Encounter (Signed)
 Copied from CRM [id number]. Topic: General - Other >> Dec 18, 2023 10:44 AM Deaijah H wrote: Reason for CRM: [facility] needs medical records from 10/18/23 by today. fax: [phone]

## 2023-12-18 NOTE — Telephone Encounter (Signed)
 Please send encounter to [facility] HIM dept so they can get needed records faxed over for pt.

## 2024-01-08 ENCOUNTER — Other Ambulatory Visit (HOSPITAL_BASED_OUTPATIENT_CLINIC_OR_DEPARTMENT_OTHER): Payer: Self-pay

## 2024-01-22 ENCOUNTER — Other Ambulatory Visit (HOSPITAL_BASED_OUTPATIENT_CLINIC_OR_DEPARTMENT_OTHER): Payer: Self-pay

## 2024-01-22 ENCOUNTER — Encounter (HOSPITAL_BASED_OUTPATIENT_CLINIC_OR_DEPARTMENT_OTHER): Payer: Self-pay | Admitting: Family Medicine

## 2024-01-22 DIAGNOSIS — I10 Essential (primary) hypertension: Secondary | ICD-10-CM

## 2024-01-23 ENCOUNTER — Other Ambulatory Visit (HOSPITAL_BASED_OUTPATIENT_CLINIC_OR_DEPARTMENT_OTHER): Payer: Self-pay

## 2024-01-23 MED ORDER — LOSARTAN POTASSIUM 50 MG PO TABS
50.0000 mg | ORAL_TABLET | Freq: Every day | ORAL | 1 refills | Status: AC
  Filled 2024-01-23: qty 30, 30d supply, fill #0
  Filled 2024-02-27: qty 30, 30d supply, fill #1

## 2024-05-16 ENCOUNTER — Ambulatory Visit: Admitting: Endocrinology

## 2024-10-22 ENCOUNTER — Encounter (HOSPITAL_BASED_OUTPATIENT_CLINIC_OR_DEPARTMENT_OTHER): Admitting: Family Medicine
